# Patient Record
Sex: Female | Born: 1940 | Race: Black or African American | Hispanic: No | Marital: Married | Smoking: Never smoker
Health system: Southern US, Community
[De-identification: ages and names within clinical notes are randomized; demographics above are authoritative.]

## PROBLEM LIST (undated history)

## (undated) DIAGNOSIS — E7800 Pure hypercholesterolemia, unspecified: Secondary | ICD-10-CM

## (undated) DIAGNOSIS — I10 Essential (primary) hypertension: Secondary | ICD-10-CM

## (undated) DIAGNOSIS — J302 Other seasonal allergic rhinitis: Secondary | ICD-10-CM

## (undated) HISTORY — PX: ABDOMINAL HYSTERECTOMY: SHX81

## (undated) HISTORY — PX: COLONOSCOPY: SHX174

---

## 2001-07-25 ENCOUNTER — Other Ambulatory Visit: Admission: RE | Admit: 2001-07-25 | Discharge: 2001-07-25 | Payer: Self-pay | Admitting: Family Medicine

## 2001-12-05 ENCOUNTER — Ambulatory Visit (HOSPITAL_COMMUNITY): Admission: RE | Admit: 2001-12-05 | Discharge: 2001-12-05 | Payer: Self-pay | Admitting: Family Medicine

## 2001-12-05 ENCOUNTER — Encounter: Payer: Self-pay | Admitting: Family Medicine

## 2002-12-06 ENCOUNTER — Encounter: Payer: Self-pay | Admitting: Family Medicine

## 2002-12-06 ENCOUNTER — Ambulatory Visit (HOSPITAL_COMMUNITY): Admission: RE | Admit: 2002-12-06 | Discharge: 2002-12-06 | Payer: Self-pay | Admitting: Family Medicine

## 2003-12-09 ENCOUNTER — Ambulatory Visit (HOSPITAL_COMMUNITY): Admission: RE | Admit: 2003-12-09 | Discharge: 2003-12-09 | Payer: Self-pay | Admitting: Family Medicine

## 2004-12-29 ENCOUNTER — Ambulatory Visit (HOSPITAL_COMMUNITY): Admission: RE | Admit: 2004-12-29 | Discharge: 2004-12-29 | Payer: Self-pay | Admitting: Family Medicine

## 2006-01-04 ENCOUNTER — Ambulatory Visit (HOSPITAL_COMMUNITY): Admission: RE | Admit: 2006-01-04 | Discharge: 2006-01-04 | Payer: Self-pay | Admitting: Family Medicine

## 2007-01-09 ENCOUNTER — Ambulatory Visit (HOSPITAL_COMMUNITY): Admission: RE | Admit: 2007-01-09 | Discharge: 2007-01-09 | Payer: Self-pay | Admitting: Family Medicine

## 2007-02-11 ENCOUNTER — Emergency Department (HOSPITAL_COMMUNITY): Admission: EM | Admit: 2007-02-11 | Discharge: 2007-02-11 | Payer: Self-pay | Admitting: Emergency Medicine

## 2008-07-16 ENCOUNTER — Ambulatory Visit (HOSPITAL_COMMUNITY): Admission: RE | Admit: 2008-07-16 | Discharge: 2008-07-16 | Payer: Self-pay | Admitting: Family Medicine

## 2009-07-21 ENCOUNTER — Ambulatory Visit (HOSPITAL_COMMUNITY): Admission: RE | Admit: 2009-07-21 | Discharge: 2009-07-21 | Payer: Self-pay | Admitting: Family Medicine

## 2010-07-23 ENCOUNTER — Ambulatory Visit (HOSPITAL_COMMUNITY): Admission: RE | Admit: 2010-07-23 | Discharge: 2010-07-23 | Payer: Self-pay | Admitting: Family Medicine

## 2011-06-25 ENCOUNTER — Other Ambulatory Visit (HOSPITAL_COMMUNITY): Payer: Self-pay | Admitting: Family Medicine

## 2011-06-25 DIAGNOSIS — Z139 Encounter for screening, unspecified: Secondary | ICD-10-CM

## 2011-07-26 ENCOUNTER — Ambulatory Visit (HOSPITAL_COMMUNITY)
Admission: RE | Admit: 2011-07-26 | Discharge: 2011-07-26 | Disposition: A | Discharge status: Home / Self Care | Payer: Medicare Other | Source: Ambulatory Visit | Attending: Family Medicine | Admitting: Family Medicine

## 2011-07-26 DIAGNOSIS — Z139 Encounter for screening, unspecified: Secondary | ICD-10-CM

## 2011-07-26 DIAGNOSIS — Z1231 Encounter for screening mammogram for malignant neoplasm of breast: Secondary | ICD-10-CM | POA: Insufficient documentation

## 2012-06-21 ENCOUNTER — Other Ambulatory Visit (HOSPITAL_COMMUNITY): Payer: Self-pay | Admitting: Family Medicine

## 2012-06-21 DIAGNOSIS — Z139 Encounter for screening, unspecified: Secondary | ICD-10-CM

## 2012-07-27 ENCOUNTER — Ambulatory Visit (HOSPITAL_COMMUNITY)
Admission: RE | Admit: 2012-07-27 | Discharge: 2012-07-27 | Disposition: A | Discharge status: Home / Self Care | Payer: Medicare Other | Source: Ambulatory Visit | Attending: Family Medicine | Admitting: Family Medicine

## 2012-07-27 DIAGNOSIS — Z1231 Encounter for screening mammogram for malignant neoplasm of breast: Secondary | ICD-10-CM | POA: Insufficient documentation

## 2012-07-27 DIAGNOSIS — Z139 Encounter for screening, unspecified: Secondary | ICD-10-CM

## 2013-06-19 ENCOUNTER — Other Ambulatory Visit (HOSPITAL_COMMUNITY): Payer: Self-pay | Admitting: Family Medicine

## 2013-06-19 DIAGNOSIS — Z139 Encounter for screening, unspecified: Secondary | ICD-10-CM

## 2013-07-30 ENCOUNTER — Ambulatory Visit (HOSPITAL_COMMUNITY)
Admission: RE | Admit: 2013-07-30 | Discharge: 2013-07-30 | Disposition: A | Discharge status: Home / Self Care | Payer: Medicare Other | Source: Ambulatory Visit | Attending: Family Medicine | Admitting: Family Medicine

## 2013-07-30 DIAGNOSIS — Z139 Encounter for screening, unspecified: Secondary | ICD-10-CM

## 2013-07-30 DIAGNOSIS — Z1231 Encounter for screening mammogram for malignant neoplasm of breast: Secondary | ICD-10-CM | POA: Insufficient documentation

## 2014-06-27 ENCOUNTER — Other Ambulatory Visit (HOSPITAL_COMMUNITY): Payer: Self-pay | Admitting: Family Medicine

## 2014-06-27 DIAGNOSIS — Z1231 Encounter for screening mammogram for malignant neoplasm of breast: Secondary | ICD-10-CM

## 2014-06-27 DIAGNOSIS — Z139 Encounter for screening, unspecified: Secondary | ICD-10-CM

## 2014-07-31 ENCOUNTER — Ambulatory Visit (HOSPITAL_COMMUNITY)
Admission: RE | Admit: 2014-07-31 | Discharge: 2014-07-31 | Disposition: A | Discharge status: Home / Self Care | Payer: Medicare Other | Source: Ambulatory Visit | Attending: Family Medicine | Admitting: Family Medicine

## 2014-07-31 DIAGNOSIS — Z1231 Encounter for screening mammogram for malignant neoplasm of breast: Secondary | ICD-10-CM | POA: Diagnosis not present

## 2014-11-05 ENCOUNTER — Encounter (INDEPENDENT_AMBULATORY_CARE_PROVIDER_SITE_OTHER): Payer: Self-pay | Admitting: *Deleted

## 2014-11-12 ENCOUNTER — Other Ambulatory Visit (INDEPENDENT_AMBULATORY_CARE_PROVIDER_SITE_OTHER): Payer: Self-pay | Admitting: *Deleted

## 2014-11-12 ENCOUNTER — Encounter (INDEPENDENT_AMBULATORY_CARE_PROVIDER_SITE_OTHER): Payer: Self-pay | Admitting: *Deleted

## 2014-11-12 DIAGNOSIS — Z1211 Encounter for screening for malignant neoplasm of colon: Secondary | ICD-10-CM

## 2014-11-25 ENCOUNTER — Telehealth (INDEPENDENT_AMBULATORY_CARE_PROVIDER_SITE_OTHER): Payer: Self-pay | Admitting: *Deleted

## 2014-11-25 DIAGNOSIS — Z1211 Encounter for screening for malignant neoplasm of colon: Secondary | ICD-10-CM

## 2014-11-25 NOTE — Telephone Encounter (Signed)
Patient needs movi prep 

## 2014-11-26 MED ORDER — PEG-KCL-NACL-NASULF-NA ASC-C 100 G PO SOLR: 1.0000 | Freq: Once | ORAL | Status: DC

## 2014-12-13 ENCOUNTER — Telehealth (INDEPENDENT_AMBULATORY_CARE_PROVIDER_SITE_OTHER): Payer: Self-pay | Admitting: *Deleted

## 2014-12-13 NOTE — Telephone Encounter (Signed)
agree

## 2014-12-13 NOTE — Telephone Encounter (Signed)
Referring MD/PCP: tcs   Procedure: screening  Reason/Indication:  Yes, more than 10 years ago  Has patient had this procedure before?  no  If so, when, by whom and where?    Is there a family history of colon cancer?  no  Who?  What age when diagnosed?    Is patient diabetic?   no      Does patient have prosthetic heart valve?  no  Do you have a pacemaker?  no  Has patient ever had endocarditis? no  Has patient had joint replacement within last 12 months?  no  Does patient tend to be constipated or take laxatives? no  Is patient on Coumadin, Plavix and/or Aspirin? no  Medications: hctz 12.5 mg daily, simvastatin 40 mg daily, amlodipine 10 mg daily  Allergies: nkda  Medication Adjustment:   Procedure date & time: 01/09/15 at 830

## 2015-01-09 ENCOUNTER — Ambulatory Visit (HOSPITAL_COMMUNITY)
Admission: RE | Admit: 2015-01-09 | Discharge: 2015-01-09 | Disposition: A | Discharge status: Home Health | Payer: Medicare Other | Source: Ambulatory Visit | Attending: Internal Medicine | Admitting: Internal Medicine

## 2015-01-09 ENCOUNTER — Encounter (HOSPITAL_COMMUNITY)
Admission: RE | Disposition: A | Discharge status: Home Health | Payer: Self-pay | Source: Ambulatory Visit | Attending: Internal Medicine

## 2015-01-09 ENCOUNTER — Encounter (HOSPITAL_COMMUNITY): Payer: Self-pay | Admitting: *Deleted

## 2015-01-09 DIAGNOSIS — E780 Pure hypercholesterolemia: Secondary | ICD-10-CM | POA: Diagnosis not present

## 2015-01-09 DIAGNOSIS — D125 Benign neoplasm of sigmoid colon: Secondary | ICD-10-CM | POA: Diagnosis not present

## 2015-01-09 DIAGNOSIS — K649 Unspecified hemorrhoids: Secondary | ICD-10-CM | POA: Insufficient documentation

## 2015-01-09 DIAGNOSIS — Z1211 Encounter for screening for malignant neoplasm of colon: Secondary | ICD-10-CM | POA: Diagnosis not present

## 2015-01-09 DIAGNOSIS — K648 Other hemorrhoids: Secondary | ICD-10-CM | POA: Diagnosis not present

## 2015-01-09 DIAGNOSIS — J302 Other seasonal allergic rhinitis: Secondary | ICD-10-CM | POA: Insufficient documentation

## 2015-01-09 DIAGNOSIS — K5730 Diverticulosis of large intestine without perforation or abscess without bleeding: Secondary | ICD-10-CM | POA: Diagnosis not present

## 2015-01-09 DIAGNOSIS — I10 Essential (primary) hypertension: Secondary | ICD-10-CM | POA: Diagnosis not present

## 2015-01-09 DIAGNOSIS — Z79899 Other long term (current) drug therapy: Secondary | ICD-10-CM | POA: Insufficient documentation

## 2015-01-09 DIAGNOSIS — D124 Benign neoplasm of descending colon: Secondary | ICD-10-CM | POA: Diagnosis not present

## 2015-01-09 HISTORY — PX: COLONOSCOPY: SHX5424

## 2015-01-09 HISTORY — DX: Essential (primary) hypertension: I10

## 2015-01-09 HISTORY — DX: Other seasonal allergic rhinitis: J30.2

## 2015-01-09 HISTORY — DX: Pure hypercholesterolemia, unspecified: E78.00

## 2015-01-09 SURGERY — COLONOSCOPY
Anesthesia: Moderate Sedation

## 2015-01-09 MED ORDER — MEPERIDINE HCL 50 MG/ML IJ SOLN
INTRAMUSCULAR | Status: DC
Start: 2015-01-09 — End: 2015-01-09
  Filled 2015-01-09: qty 1

## 2015-01-09 MED ORDER — SODIUM CHLORIDE 0.9 % IV SOLN
INTRAVENOUS | Status: DC
  Administered 2015-01-09: 08:00:00 via INTRAVENOUS

## 2015-01-09 MED ORDER — MIDAZOLAM HCL 5 MG/5ML IJ SOLN
INTRAMUSCULAR | Status: DC | PRN
  Administered 2015-01-09 (×3): 2 mg via INTRAVENOUS

## 2015-01-09 MED ORDER — MIDAZOLAM HCL 5 MG/5ML IJ SOLN
INTRAMUSCULAR | Status: AC
  Filled 2015-01-09: qty 10

## 2015-01-09 MED ORDER — STERILE WATER FOR IRRIGATION IR SOLN
Status: DC | PRN
  Administered 2015-01-09: 09:00:00

## 2015-01-09 MED ORDER — MEPERIDINE HCL 50 MG/ML IJ SOLN
INTRAMUSCULAR | Status: DC | PRN
  Administered 2015-01-09 (×2): 25 mg via INTRAVENOUS

## 2015-01-09 NOTE — Discharge Instructions (Signed)
No aspirin or NSAIDs for 1 week. Resume usual medications and high fiber diet. No driving for 24 hours. Physician will call with biopsy results.  Colonoscopy, Care After Refer to this sheet in the next few weeks. These instructions provide you with information on caring for yourself after your procedure. Your health care provider may also give you more specific instructions. Your treatment has been planned according to current medical practices, but problems sometimes occur. Call your health care provider if you have any problems or questions after your procedure. WHAT TO EXPECT AFTER THE PROCEDURE  After your procedure, it is typical to have the following:  A small amount of blood in your stool.  Moderate amounts of gas and mild abdominal cramping or bloating. HOME CARE INSTRUCTIONS  Do not drive, operate machinery, or sign important documents for 24 hours.  You may shower and resume your regular physical activities, but move at a slower pace for the first 24 hours.  Take frequent rest periods for the first 24 hours.  Walk around or put a warm pack on your abdomen to help reduce abdominal cramping and bloating.  Drink enough fluids to keep your urine clear or pale yellow.  You may resume your normal diet as instructed by your health care provider. Avoid heavy or fried foods that are hard to digest.  Avoid drinking alcohol for 24 hours or as instructed by your health care provider.  Only take over-the-counter or prescription medicines as directed by your health care provider.  If a tissue sample (biopsy) was taken during your procedure:  Do not take aspirin or blood thinners for 7 days, or as instructed by your health care provider.  Do not drink alcohol for 7 days, or as instructed by your health care provider.  Eat soft foods for the first 24 hours. SEEK MEDICAL CARE IF: You have persistent spotting of blood in your stool 2-3 days after the procedure. SEEK IMMEDIATE MEDICAL  CARE IF:  You have more than a small spotting of blood in your stool.  You pass large blood clots in your stool.  Your abdomen is swollen (distended).  You have nausea or vomiting.  You have a fever.  You have increasing abdominal pain that is not relieved with medicine. Colon Polyps Polyps are lumps of extra tissue growing inside the body. Polyps can grow in the large intestine (colon). Most colon polyps are noncancerous (benign). However, some colon polyps can become cancerous over time. Polyps that are larger than a pea may be harmful. To be safe, caregivers remove and test all polyps. CAUSES  Polyps form when mutations in the genes cause your cells to grow and divide even though no more tissue is needed. RISK FACTORS There are a number of risk factors that can increase your chances of getting colon polyps. They include:  Being older than 50 years.  Family history of colon polyps or colon cancer.  Long-term colon diseases, such as colitis or Crohn disease.  Being overweight.  Smoking.  Being inactive.  Drinking too much alcohol. SYMPTOMS  Most small polyps do not cause symptoms. If symptoms are present, they may include:  Blood in the stool. The stool may look dark red or black.  Constipation or diarrhea that lasts longer than 1 week. DIAGNOSIS People often do not know they have polyps until their caregiver finds them during a regular checkup. Your caregiver can use 4 tests to check for polyps:  Digital rectal exam. The caregiver wears gloves and feels  inside the rectum. This test would find polyps only in the rectum.  Barium enema. The caregiver puts a liquid called barium into your rectum before taking X-rays of your colon. Barium makes your colon look white. Polyps are dark, so they are easy to see in the X-ray pictures.  Sigmoidoscopy. A thin, flexible tube (sigmoidoscope) is placed into your rectum. The sigmoidoscope has a light and tiny camera in it. The  caregiver uses the sigmoidoscope to look at the last third of your colon.  Colonoscopy. This test is like sigmoidoscopy, but the caregiver looks at the entire colon. This is the most common method for finding and removing polyps. TREATMENT  Any polyps will be removed during a sigmoidoscopy or colonoscopy. The polyps are then tested for cancer. PREVENTION  To help lower your risk of getting more colon polyps:  Eat plenty of fruits and vegetables. Avoid eating fatty foods.  Do not smoke.  Avoid drinking alcohol.  Exercise every day.  Lose weight if recommended by your caregiver.  Eat plenty of calcium and folate. Foods that are rich in calcium include milk, cheese, and broccoli. Foods that are rich in folate include chickpeas, kidney beans, and spinach. HOME CARE INSTRUCTIONS Keep all follow-up appointments as directed by your caregiver. You may need periodic exams to check for polyps. SEEK MEDICAL CARE IF: You notice bleeding during a bowel movement. Diverticulosis Diverticulosis is the condition that develops when small pouches (diverticula) form in the wall of your colon. Your colon, or large intestine, is where water is absorbed and stool is formed. The pouches form when the inside layer of your colon pushes through weak spots in the outer layers of your colon. CAUSES  No one knows exactly what causes diverticulosis. RISK FACTORS  Being older than 56. Your risk for this condition increases with age. Diverticulosis is rare in people younger than 40 years. By age 65, almost everyone has it.  Eating a low-fiber diet.  Being frequently constipated.  Being overweight.  Not getting enough exercise.  Smoking.  Taking over-the-counter pain medicines, like aspirin and ibuprofen. SYMPTOMS  Most people with diverticulosis do not have symptoms. DIAGNOSIS  Because diverticulosis often has no symptoms, health care providers often discover the condition during an exam for other colon  problems. In many cases, a health care provider will diagnose diverticulosis while using a flexible scope to examine the colon (colonoscopy). TREATMENT  If you have never developed an infection related to diverticulosis, you may not need treatment. If you have had an infection before, treatment may include:  Eating more fruits, vegetables, and grains.  Taking a fiber supplement.  Taking a live bacteria supplement (probiotic).  Taking medicine to relax your colon. HOME CARE INSTRUCTIONS   Drink at least 6-8 glasses of water each day to prevent constipation.  Try not to strain when you have a bowel movement.  Keep all follow-up appointments. If you have had an infection before:  Increase the fiber in your diet as directed by your health care provider or dietitian.  Take a dietary fiber supplement if your health care provider approves.  Only take medicines as directed by your health care provider. SEEK MEDICAL CARE IF:   You have abdominal pain.  You have bloating.  You have cramps.  You have not gone to the bathroom in 3 days. SEEK IMMEDIATE MEDICAL CARE IF:   Your pain gets worse.  Yourbloating becomes very bad.  You have a fever or chills, and your symptoms suddenly get  worse.  You begin vomiting.  You have bowel movements that are bloody or black. MAKE SURE YOU:  Understand these instructions.  Will watch your condition.  Will get help right away if you are not doing well or get worse. Hemorrhoids Hemorrhoids are swollen veins around the rectum or anus. There are two types of hemorrhoids:   Internal hemorrhoids. These occur in the veins just inside the rectum. They may poke through to the outside and become irritated and painful.  External hemorrhoids. These occur in the veins outside the anus and can be felt as a painful swelling or hard lump near the anus. CAUSES  Pregnancy.   Obesity.   Constipation or diarrhea.   Straining to have a bowel  movement.   Sitting for long periods on the toilet.  Heavy lifting or other activity that caused you to strain.  Anal intercourse. SYMPTOMS   Pain.   Anal itching or irritation.   Rectal bleeding.   Fecal leakage.   Anal swelling.   One or more lumps around the anus.  DIAGNOSIS  Your caregiver may be able to diagnose hemorrhoids by visual examination. Other examinations or tests that may be performed include:   Examination of the rectal area with a gloved hand (digital rectal exam).   Examination of anal canal using a small tube (scope).   A blood test if you have lost a significant amount of blood.  A test to look inside the colon (sigmoidoscopy or colonoscopy). TREATMENT Most hemorrhoids can be treated at home. However, if symptoms do not seem to be getting better or if you have a lot of rectal bleeding, your caregiver may perform a procedure to help make the hemorrhoids get smaller or remove them completely. Possible treatments include:   Placing a rubber band at the base of the hemorrhoid to cut off the circulation (rubber band ligation).   Injecting a chemical to shrink the hemorrhoid (sclerotherapy).   Using a tool to burn the hemorrhoid (infrared light therapy).   Surgically removing the hemorrhoid (hemorrhoidectomy).   Stapling the hemorrhoid to block blood flow to the tissue (hemorrhoid stapling).  HOME CARE INSTRUCTIONS   Eat foods with fiber, such as whole grains, beans, nuts, fruits, and vegetables. Ask your doctor about taking products with added fiber in them (fibersupplements).  Increase fluid intake. Drink enough water and fluids to keep your urine clear or pale yellow.   Exercise regularly.   Go to the bathroom when you have the urge to have a bowel movement. Do not wait.   Avoid straining to have bowel movements.   Keep the anal area dry and clean. Use wet toilet paper or moist towelettes after a bowel movement.   Medicated  creams and suppositories may be used or applied as directed.   Only take over-the-counter or prescription medicines as directed by your caregiver.   Take warm sitz baths for 15-20 minutes, 3-4 times a day to ease pain and discomfort.   Place ice packs on the hemorrhoids if they are tender and swollen. Using ice packs between sitz baths may be helpful.   Put ice in a plastic bag.   Place a towel between your skin and the bag.   Leave the ice on for 15-20 minutes, 3-4 times a day.   Do not use a donut-shaped pillow or sit on the toilet for long periods. This increases blood pooling and pain.  SEEK MEDICAL CARE IF:  You have increasing pain and swelling that is  not controlled by treatment or medicine.  You have uncontrolled bleeding.  You have difficulty or you are unable to have a bowel movement.  You have pain or inflammation outside the area of the hemorrhoids. MAKE SURE YOU:  Understand these instructions.  Will watch your condition.  Will get help right away if you are not doing well or get worse.

## 2015-01-09 NOTE — H&P (Signed)
Michelle Mcclain is an 74 y.o. female.   Chief Complaint: Patient is here for colonoscopy. HPI: Patient is 74 year old African female who is here for screening colonoscopy. She denies abdominal pain change in bowel habits rectal bleeding. Last colonoscopy was 10 years ago but she does not remember where. Family history is negative for CRC.  Past Medical History  Diagnosis Date  . Hypertension   . Hypercholesteremia   . Seasonal allergies     Past Surgical History  Procedure Laterality Date  . Abdominal hysterectomy    . Colonoscopy      History reviewed. No pertinent family history. Social History:  reports that she has never smoked. She does not have any smokeless tobacco history on file. She reports that she does not drink alcohol or use illicit drugs.  Allergies: No Known Allergies  Medications Prior to Admission  Medication Sig Dispense Refill  . amLODipine (NORVASC) 10 MG tablet Take 10 mg by mouth daily.    . cholecalciferol (VITAMIN D) 1000 UNITS tablet Take 1,000 Units by mouth daily.    . hydrochlorothiazide (MICROZIDE) 12.5 MG capsule Take 12.5 mg by mouth daily.    Marland Kitchen loratadine (CLARITIN) 10 MG tablet Take 10 mg by mouth daily.    . Multiple Vitamins-Minerals (MULTIVITAMINS THER. W/MINERALS) TABS tablet Take 1 tablet by mouth daily.    . peg 3350 powder (MOVIPREP) 100 G SOLR Take 1 kit (200 g total) by mouth once. 1 kit 0  . simvastatin (ZOCOR) 40 MG tablet Take 40 mg by mouth daily.    . vitamin C (ASCORBIC ACID) 500 MG tablet Take 500 mg by mouth daily.      No results found for this or any previous visit (from the past 48 hour(s)). No results found.  ROS  Blood pressure 166/91, pulse 110, temperature 97.8 F (36.6 C), temperature source Oral, resp. rate 22, height 5' 8"  (1.727 m), weight 215 lb (97.523 kg), SpO2 96 %. Physical Exam  Constitutional: She appears well-developed and well-nourished.  HENT:  Mouth/Throat: Oropharynx is clear and moist.   Eyes: Conjunctivae are normal. No scleral icterus.  Neck: No thyromegaly present.  Cardiovascular: Normal rate, regular rhythm and normal heart sounds.   No murmur heard. Respiratory: Effort normal and breath sounds normal.  GI: Soft. She exhibits no distension and no mass. There is no tenderness.  Musculoskeletal: She exhibits no edema.  Lymphadenopathy:    She has no cervical adenopathy.  Neurological: She is alert.  Skin: Skin is warm and dry.     Assessment/Plan Average risk screening colonoscopy.  Rivers Gassmann U 01/09/2015, 8:45 AM

## 2015-01-09 NOTE — Op Note (Signed)
COLONOSCOPY PROCEDURE REPORT  PATIENT:  Michelle Mcclain  MR#:  [id number] Birthdate:  [birth year], 74 y.o., female Endoscopist:  Dr. Rogene Houston, MD Referred By:  Dr. Lanette Hampshire, MD  Procedure Date: 01/09/2015  Procedure:   Colonoscopy  Indications:  Patient is 74 year old African-American female who is here for average risk screening colonoscopy.  Informed Consent:  The procedure and risks were reviewed with the patient and informed consent was obtained.  Medications:  Demerol 50 mg IV Versed 6 mg IV  Description of procedure:  After a digital rectal exam was performed, that colonoscope was advanced from the anus through the rectum and colon to the area of the cecum, ileocecal valve and appendiceal orifice. The cecum was deeply intubated. These structures were well-seen and photographed for the record. From the level of the cecum and ileocecal valve, the scope was slowly and cautiously withdrawn. The mucosal surfaces were carefully surveyed utilizing scope tip to flexion to facilitate fold flattening as needed. The scope was pulled down into the rectum where a thorough exam including retroflexion was performed.  Findings:   Prep excellent. 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 3 mm polyp noted at distal sigmoid colon. It was cold snared. 5 mm polyp with red surface was hot snared also from distal sigmoid colon. Both of these polyps were submitted together. Normal rectal mucosa. Hemorrhoids noted above and below the dentate line.    Therapeutic/Diagnostic Maneuvers Performed:  See above  Complications:  None  Cecal Withdrawal Time:  15  minutes  Impression:  Examination performed to cecum. Moderate number of diverticula at sigmoid colon along with few at hepatic flexure. 3 mm polyp cold snared from distal sigmoid colon. 5 mm polyp hot snare from distal sigmoid colon. Both of these polyps were submitted  together  Recommendations:  Standard instructions given. I will contact patient with biopsy results and further recommendations.  Rykar Lebleu U  01/09/2015 9:32 AM  CC: Dr. Lanette Hampshire, MD & Dr. Rayne Du ref. provider found

## 2015-01-10 ENCOUNTER — Encounter (HOSPITAL_COMMUNITY): Payer: Self-pay | Admitting: Internal Medicine

## 2015-01-21 ENCOUNTER — Encounter (INDEPENDENT_AMBULATORY_CARE_PROVIDER_SITE_OTHER): Payer: Self-pay | Admitting: *Deleted

## 2015-06-27 ENCOUNTER — Other Ambulatory Visit (HOSPITAL_COMMUNITY): Payer: Self-pay | Admitting: Family Medicine

## 2015-06-27 DIAGNOSIS — Z1231 Encounter for screening mammogram for malignant neoplasm of breast: Secondary | ICD-10-CM

## 2015-08-04 ENCOUNTER — Ambulatory Visit (HOSPITAL_COMMUNITY)
Admission: RE | Admit: 2015-08-04 | Discharge: 2015-08-04 | Disposition: A | Discharge status: Home / Self Care | Payer: Medicare Other | Source: Ambulatory Visit | Attending: Family Medicine | Admitting: Family Medicine

## 2015-08-04 DIAGNOSIS — Z1231 Encounter for screening mammogram for malignant neoplasm of breast: Secondary | ICD-10-CM | POA: Diagnosis present

## 2016-07-05 ENCOUNTER — Other Ambulatory Visit (HOSPITAL_COMMUNITY): Payer: Self-pay | Admitting: Internal Medicine

## 2016-07-05 DIAGNOSIS — Z1231 Encounter for screening mammogram for malignant neoplasm of breast: Secondary | ICD-10-CM

## 2016-08-05 ENCOUNTER — Ambulatory Visit (HOSPITAL_COMMUNITY)
Admission: RE | Admit: 2016-08-05 | Discharge: 2016-08-05 | Disposition: A | Discharge status: Home / Self Care | Payer: Medicare Other | Source: Ambulatory Visit | Attending: Internal Medicine | Admitting: Internal Medicine

## 2016-08-05 DIAGNOSIS — Z1231 Encounter for screening mammogram for malignant neoplasm of breast: Secondary | ICD-10-CM | POA: Insufficient documentation

## 2017-06-24 ENCOUNTER — Other Ambulatory Visit (HOSPITAL_COMMUNITY): Payer: Self-pay | Admitting: Internal Medicine

## 2017-06-24 DIAGNOSIS — Z1231 Encounter for screening mammogram for malignant neoplasm of breast: Secondary | ICD-10-CM

## 2017-08-08 ENCOUNTER — Ambulatory Visit (HOSPITAL_COMMUNITY): Payer: Medicare Other

## 2017-08-10 ENCOUNTER — Ambulatory Visit (HOSPITAL_COMMUNITY)
Admission: RE | Admit: 2017-08-10 | Discharge: 2017-08-10 | Disposition: A | Discharge status: Home / Self Care | Payer: Medicare Other | Source: Ambulatory Visit | Attending: Internal Medicine | Admitting: Internal Medicine

## 2017-08-10 DIAGNOSIS — Z1231 Encounter for screening mammogram for malignant neoplasm of breast: Secondary | ICD-10-CM

## 2018-06-30 ENCOUNTER — Other Ambulatory Visit (HOSPITAL_COMMUNITY): Payer: Self-pay | Admitting: Internal Medicine

## 2018-06-30 DIAGNOSIS — Z1231 Encounter for screening mammogram for malignant neoplasm of breast: Secondary | ICD-10-CM

## 2018-08-14 ENCOUNTER — Ambulatory Visit (HOSPITAL_COMMUNITY)
Admission: RE | Admit: 2018-08-14 | Discharge: 2018-08-14 | Disposition: A | Discharge status: Home / Self Care | Payer: Medicare Other | Source: Ambulatory Visit | Attending: Internal Medicine | Admitting: Internal Medicine

## 2018-08-14 ENCOUNTER — Encounter (HOSPITAL_COMMUNITY): Payer: Self-pay

## 2018-08-14 DIAGNOSIS — Z1231 Encounter for screening mammogram for malignant neoplasm of breast: Secondary | ICD-10-CM

## 2019-07-09 ENCOUNTER — Other Ambulatory Visit (HOSPITAL_COMMUNITY): Payer: Self-pay | Admitting: Internal Medicine

## 2019-07-09 DIAGNOSIS — Z1231 Encounter for screening mammogram for malignant neoplasm of breast: Secondary | ICD-10-CM

## 2019-08-22 ENCOUNTER — Other Ambulatory Visit: Payer: Self-pay

## 2019-08-22 ENCOUNTER — Ambulatory Visit (HOSPITAL_COMMUNITY)
Admission: RE | Admit: 2019-08-22 | Discharge: 2019-08-22 | Disposition: A | Discharge status: Home / Self Care | Payer: Medicare Other | Source: Ambulatory Visit | Attending: Internal Medicine | Admitting: Internal Medicine

## 2019-08-22 DIAGNOSIS — Z1231 Encounter for screening mammogram for malignant neoplasm of breast: Secondary | ICD-10-CM | POA: Diagnosis present

## 2019-09-12 ENCOUNTER — Other Ambulatory Visit: Payer: Self-pay

## 2019-09-12 ENCOUNTER — Ambulatory Visit: Payer: Medicare Other | Attending: Internal Medicine

## 2019-09-12 DIAGNOSIS — Z20822 Contact with and (suspected) exposure to covid-19: Secondary | ICD-10-CM

## 2019-09-13 LAB — NOVEL CORONAVIRUS, NAA: SARS-CoV-2, NAA: NOT DETECTED

## 2020-01-25 ENCOUNTER — Other Ambulatory Visit: Payer: Medicare Other

## 2020-07-21 ENCOUNTER — Other Ambulatory Visit (HOSPITAL_COMMUNITY): Payer: Self-pay | Admitting: Adult Health

## 2020-07-21 ENCOUNTER — Other Ambulatory Visit (HOSPITAL_COMMUNITY): Payer: Self-pay | Admitting: General Practice

## 2020-07-21 DIAGNOSIS — Z1231 Encounter for screening mammogram for malignant neoplasm of breast: Secondary | ICD-10-CM

## 2020-08-25 ENCOUNTER — Ambulatory Visit (HOSPITAL_COMMUNITY): Payer: Medicare Other

## 2020-10-22 ENCOUNTER — Other Ambulatory Visit: Payer: Self-pay

## 2020-10-22 ENCOUNTER — Ambulatory Visit (HOSPITAL_COMMUNITY)
Admission: RE | Admit: 2020-10-22 | Discharge: 2020-10-22 | Disposition: A | Discharge status: Home / Self Care | Payer: Medicare Other | Source: Ambulatory Visit | Attending: Adult Health | Admitting: Adult Health

## 2020-10-22 DIAGNOSIS — Z1231 Encounter for screening mammogram for malignant neoplasm of breast: Secondary | ICD-10-CM | POA: Insufficient documentation

## 2021-04-22 ENCOUNTER — Other Ambulatory Visit (HOSPITAL_COMMUNITY): Payer: Self-pay | Admitting: Adult Health

## 2021-04-22 ENCOUNTER — Other Ambulatory Visit: Payer: Self-pay

## 2021-04-22 ENCOUNTER — Ambulatory Visit (HOSPITAL_COMMUNITY)
Admission: RE | Admit: 2021-04-22 | Discharge: 2021-04-22 | Disposition: A | Discharge status: Home / Self Care | Payer: Medicare Other | Source: Ambulatory Visit | Attending: Adult Health | Admitting: Adult Health

## 2021-04-22 DIAGNOSIS — M25561 Pain in right knee: Secondary | ICD-10-CM

## 2022-03-30 IMAGING — DX DG KNEE COMPLETE 4+V*R*
4 series · 4 of 4 positions shown · non-contrast
Comparison: None.

CLINICAL DATA: Right knee pain.

EXAM:
RIGHT KNEE - COMPLETE 4+ VIEW

[knee ap]
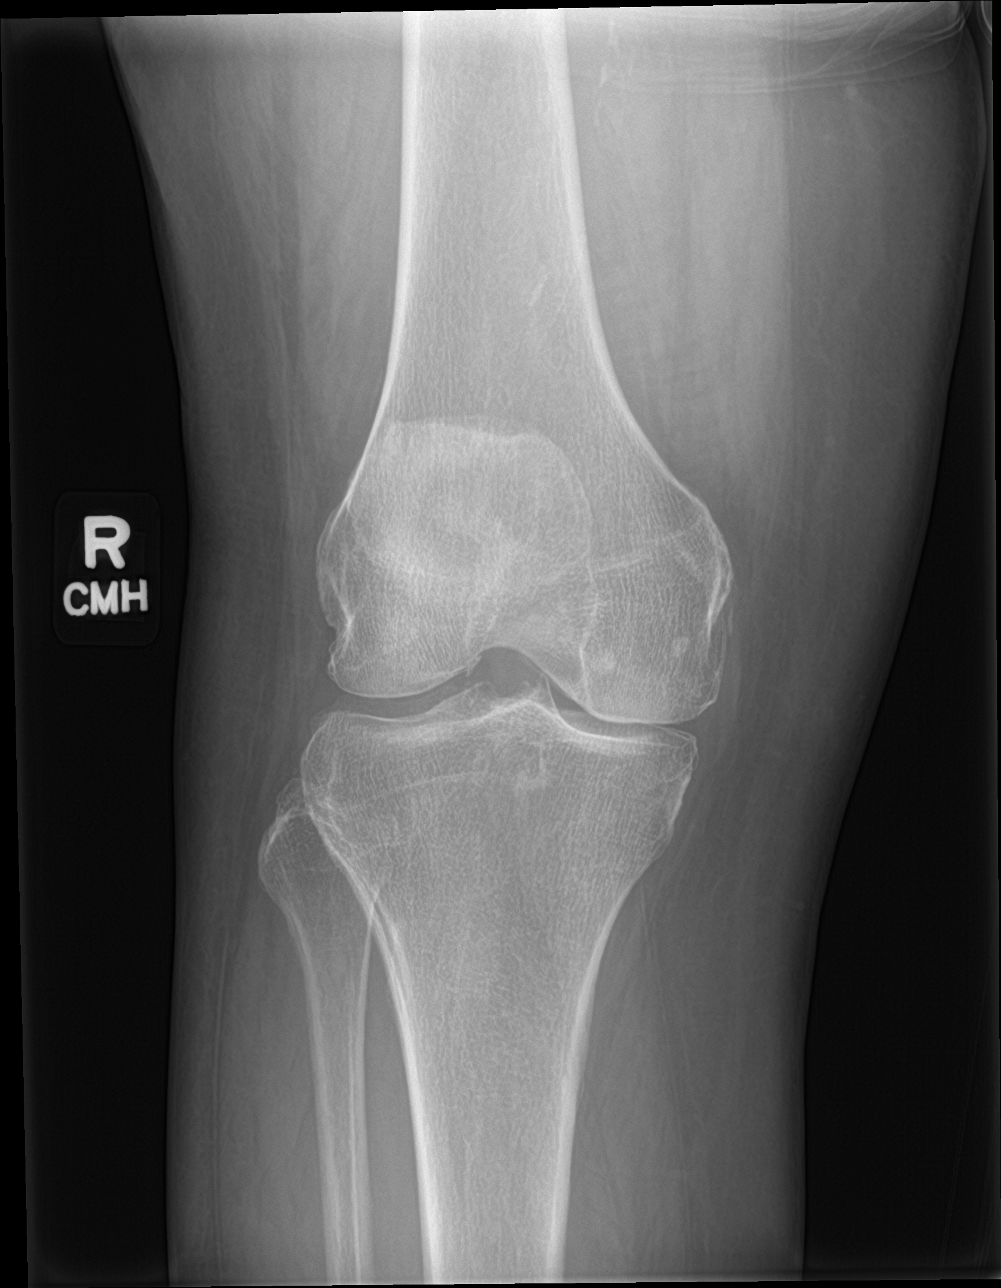

[knee obl (1 of 2)]
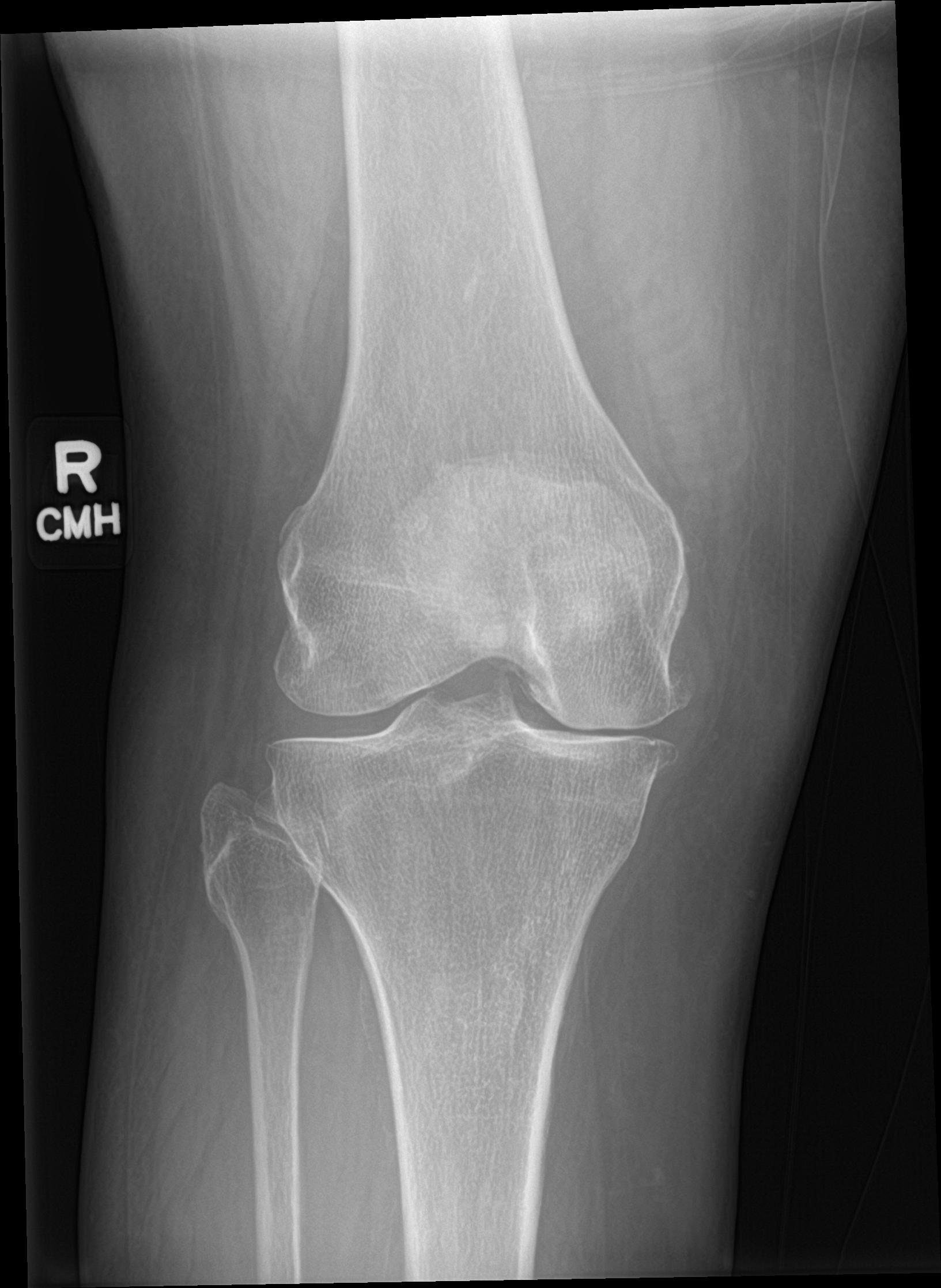

[knee obl (2 of 2)]
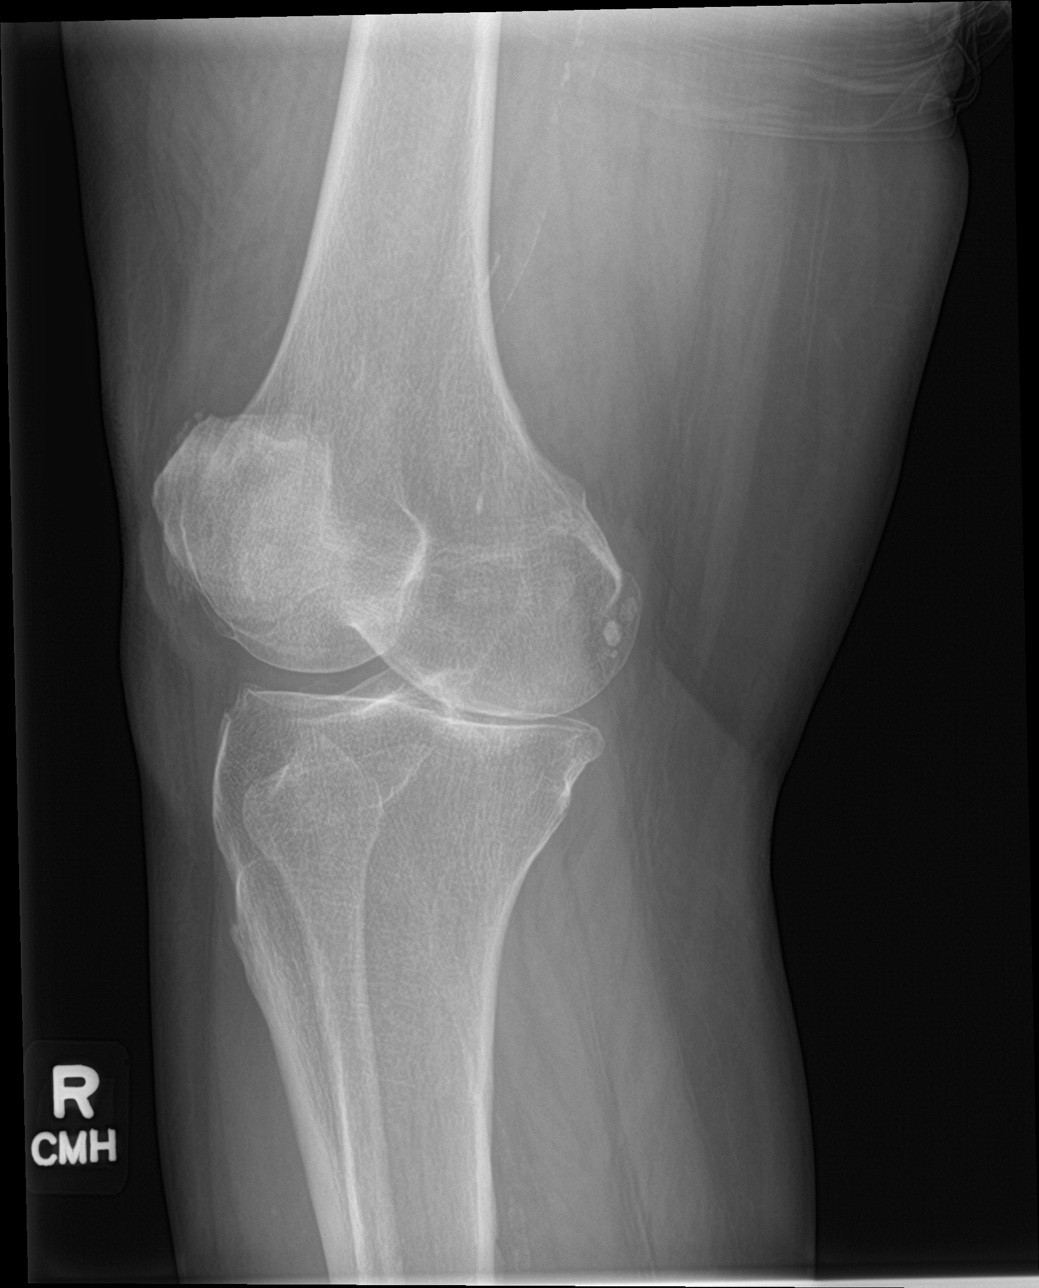

[knee lat]
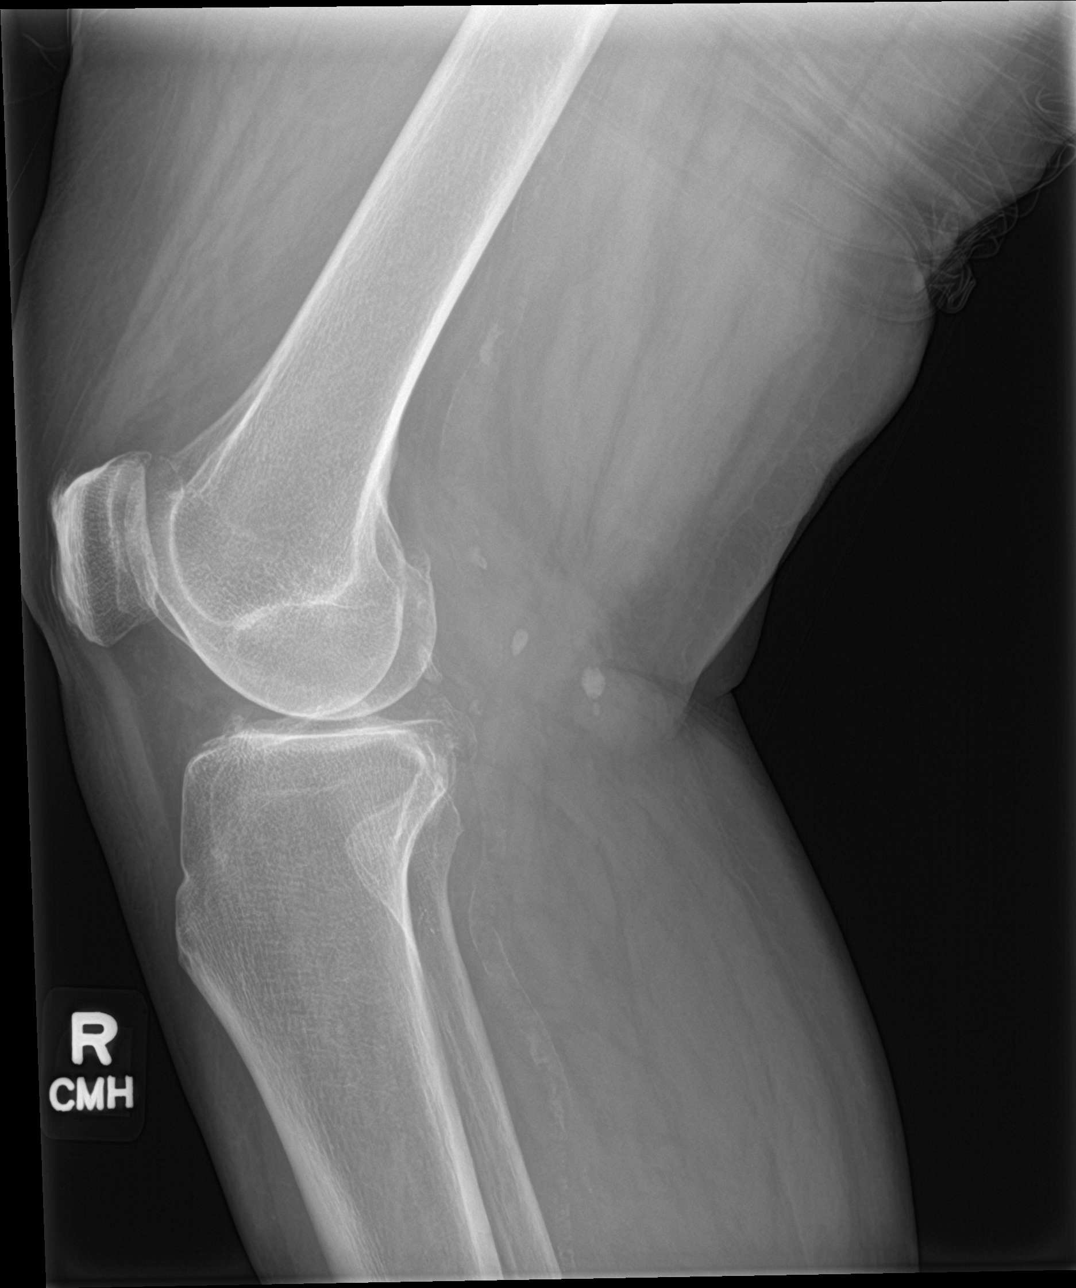

[4 of 4 positions shown; findings below may reference images not displayed]

FINDINGS: No evidence of fracture, dislocation, or joint effusion. Moderate
osteoarthritis with moderate right joint space narrowing. Mild
patellofemoral disease. No bony lesions identified. Soft tissues are
unremarkable.
IMPRESSION: Moderate osteoarthritis with moderate medial joint space narrowing
present. Mild patellofemoral disease.

## 2023-09-05 ENCOUNTER — Encounter (HOSPITAL_COMMUNITY): Payer: Self-pay | Admitting: Emergency Medicine

## 2023-09-05 ENCOUNTER — Emergency Department (HOSPITAL_COMMUNITY): Payer: Medicare Other

## 2023-09-05 ENCOUNTER — Inpatient Hospital Stay (HOSPITAL_COMMUNITY)
Admission: EM | Admit: 2023-09-05 | Discharge: 2023-09-10 | DRG: 871 | Disposition: A | Discharge status: Skilled Nursing Facility | Payer: Medicare Other | Attending: Internal Medicine | Admitting: Internal Medicine

## 2023-09-05 ENCOUNTER — Other Ambulatory Visit: Payer: Self-pay

## 2023-09-05 DIAGNOSIS — A419 Sepsis, unspecified organism: Principal | ICD-10-CM | POA: Diagnosis present

## 2023-09-05 DIAGNOSIS — G9341 Metabolic encephalopathy: Secondary | ICD-10-CM | POA: Insufficient documentation

## 2023-09-05 DIAGNOSIS — N12 Tubulo-interstitial nephritis, not specified as acute or chronic: Secondary | ICD-10-CM | POA: Diagnosis present

## 2023-09-05 DIAGNOSIS — N390 Urinary tract infection, site not specified: Secondary | ICD-10-CM | POA: Diagnosis present

## 2023-09-05 DIAGNOSIS — E119 Type 2 diabetes mellitus without complications: Secondary | ICD-10-CM

## 2023-09-05 DIAGNOSIS — E860 Dehydration: Secondary | ICD-10-CM | POA: Diagnosis not present

## 2023-09-05 DIAGNOSIS — R5381 Other malaise: Secondary | ICD-10-CM | POA: Diagnosis present

## 2023-09-05 DIAGNOSIS — Z1152 Encounter for screening for COVID-19: Secondary | ICD-10-CM

## 2023-09-05 DIAGNOSIS — E669 Obesity, unspecified: Secondary | ICD-10-CM | POA: Diagnosis present

## 2023-09-05 DIAGNOSIS — L89302 Pressure ulcer of unspecified buttock, stage 2: Secondary | ICD-10-CM | POA: Diagnosis present

## 2023-09-05 DIAGNOSIS — N3090 Cystitis, unspecified without hematuria: Principal | ICD-10-CM | POA: Diagnosis present

## 2023-09-05 DIAGNOSIS — E782 Mixed hyperlipidemia: Secondary | ICD-10-CM | POA: Insufficient documentation

## 2023-09-05 DIAGNOSIS — N179 Acute kidney failure, unspecified: Secondary | ICD-10-CM | POA: Insufficient documentation

## 2023-09-05 DIAGNOSIS — F0390 Unspecified dementia without behavioral disturbance: Secondary | ICD-10-CM | POA: Insufficient documentation

## 2023-09-05 DIAGNOSIS — R948 Abnormal results of function studies of other organs and systems: Secondary | ICD-10-CM | POA: Diagnosis present

## 2023-09-05 DIAGNOSIS — E870 Hyperosmolality and hypernatremia: Secondary | ICD-10-CM | POA: Insufficient documentation

## 2023-09-05 DIAGNOSIS — Z7901 Long term (current) use of anticoagulants: Secondary | ICD-10-CM

## 2023-09-05 DIAGNOSIS — Z79899 Other long term (current) drug therapy: Secondary | ICD-10-CM

## 2023-09-05 DIAGNOSIS — E876 Hypokalemia: Secondary | ICD-10-CM | POA: Insufficient documentation

## 2023-09-05 DIAGNOSIS — R935 Abnormal findings on diagnostic imaging of other abdominal regions, including retroperitoneum: Secondary | ICD-10-CM | POA: Insufficient documentation

## 2023-09-05 DIAGNOSIS — N3000 Acute cystitis without hematuria: Secondary | ICD-10-CM

## 2023-09-05 DIAGNOSIS — I2699 Other pulmonary embolism without acute cor pulmonale: Secondary | ICD-10-CM | POA: Insufficient documentation

## 2023-09-05 DIAGNOSIS — I2489 Other forms of acute ischemic heart disease: Secondary | ICD-10-CM | POA: Diagnosis present

## 2023-09-05 DIAGNOSIS — E1142 Type 2 diabetes mellitus with diabetic polyneuropathy: Secondary | ICD-10-CM | POA: Diagnosis present

## 2023-09-05 DIAGNOSIS — I5A Non-ischemic myocardial injury (non-traumatic): Secondary | ICD-10-CM | POA: Diagnosis not present

## 2023-09-05 DIAGNOSIS — I429 Cardiomyopathy, unspecified: Secondary | ICD-10-CM

## 2023-09-05 DIAGNOSIS — Z6832 Body mass index (BMI) 32.0-32.9, adult: Secondary | ICD-10-CM

## 2023-09-05 DIAGNOSIS — T502X5A Adverse effect of carbonic-anhydrase inhibitors, benzothiadiazides and other diuretics, initial encounter: Secondary | ICD-10-CM | POA: Diagnosis present

## 2023-09-05 DIAGNOSIS — I214 Non-ST elevation (NSTEMI) myocardial infarction: Secondary | ICD-10-CM | POA: Insufficient documentation

## 2023-09-05 DIAGNOSIS — L8990 Pressure ulcer of unspecified site, unspecified stage: Secondary | ICD-10-CM | POA: Insufficient documentation

## 2023-09-05 DIAGNOSIS — Z7982 Long term (current) use of aspirin: Secondary | ICD-10-CM

## 2023-09-05 DIAGNOSIS — R9389 Abnormal findings on diagnostic imaging of other specified body structures: Secondary | ICD-10-CM | POA: Insufficient documentation

## 2023-09-05 DIAGNOSIS — I10 Essential (primary) hypertension: Secondary | ICD-10-CM | POA: Insufficient documentation

## 2023-09-05 DIAGNOSIS — Z7984 Long term (current) use of oral hypoglycemic drugs: Secondary | ICD-10-CM

## 2023-09-05 LAB — SALICYLATE LEVEL: Salicylate Lvl: <7 mg/dL — ABNORMAL LOW (ref 7.0–30.0)

## 2023-09-05 LAB — RESP PANEL BY RT-PCR (RSV, FLU A&B, COVID)  RVPGX2
Influenza A by PCR: NEGATIVE
Influenza B by PCR: NEGATIVE
Resp Syncytial Virus by PCR: NEGATIVE
SARS Coronavirus 2 by RT PCR: NEGATIVE

## 2023-09-05 LAB — TROPONIN I (HIGH SENSITIVITY)
Troponin I (High Sensitivity): 4 ng/L (ref <18)
Troponin I (High Sensitivity): 8 ng/L (ref <18)

## 2023-09-05 LAB — COMPREHENSIVE METABOLIC PANEL
ALT: 24 U/L (ref 0–44)
AST: 31 U/L (ref 15–41)
Albumin: 4.1 g/dL (ref 3.5–5.0)
Alkaline Phosphatase: 64 U/L (ref 38–126)
Anion gap: 25 — ABNORMAL HIGH (ref 5–15)
BUN: 48 mg/dL — ABNORMAL HIGH (ref 8–23)
CO2: 20 mmol/L — ABNORMAL LOW (ref 22–32)
Calcium: 10.7 mg/dL — ABNORMAL HIGH (ref 8.9–10.3)
Chloride: 101 mmol/L (ref 98–111)
Creatinine, Ser: 1.36 mg/dL — ABNORMAL HIGH (ref 0.44–1.00)
GFR, Estimated: 39 mL/min — ABNORMAL LOW (ref >60)
Glucose, Bld: 145 mg/dL — ABNORMAL HIGH (ref 70–99)
Potassium: 4.5 mmol/L (ref 3.5–5.1)
Sodium: 146 mmol/L — ABNORMAL HIGH (ref 135–145)
Total Bilirubin: 1.2 mg/dL — ABNORMAL HIGH (ref <1.2)
Total Protein: 9.3 g/dL — ABNORMAL HIGH (ref 6.5–8.1)

## 2023-09-05 LAB — CBC WITH DIFFERENTIAL/PLATELET
Abs Immature Granulocytes: 0.05 10*3/uL (ref 0.00–0.07)
Basophils Absolute: 0 10*3/uL (ref 0.0–0.1)
Basophils Relative: 0 %
Eosinophils Absolute: 0.1 10*3/uL (ref 0.0–0.5)
Eosinophils Relative: 1 %
HCT: 48.2 % — ABNORMAL HIGH (ref 36.0–46.0)
Hemoglobin: 14.1 g/dL (ref 12.0–15.0)
Immature Granulocytes: 0 %
Lymphocytes Relative: 20 %
Lymphs Abs: 2.7 10*3/uL (ref 0.7–4.0)
MCH: 24.4 pg — ABNORMAL LOW (ref 26.0–34.0)
MCHC: 29.3 g/dL — ABNORMAL LOW (ref 30.0–36.0)
MCV: 83.4 fL (ref 80.0–100.0)
Monocytes Absolute: 1.2 10*3/uL — ABNORMAL HIGH (ref 0.1–1.0)
Monocytes Relative: 9 %
Neutro Abs: 9.1 10*3/uL — ABNORMAL HIGH (ref 1.7–7.7)
Neutrophils Relative %: 70 %
Platelets: 419 10*3/uL — ABNORMAL HIGH (ref 150–400)
RBC: 5.78 MIL/uL — ABNORMAL HIGH (ref 3.87–5.11)
RDW: 14.2 % (ref 11.5–15.5)
WBC: 13.2 10*3/uL — ABNORMAL HIGH (ref 4.0–10.5)
nRBC: 0 % (ref 0.0–0.2)

## 2023-09-05 LAB — URINALYSIS, W/ REFLEX TO CULTURE (INFECTION SUSPECTED)
Bilirubin Urine: NEGATIVE
Glucose, UA: NEGATIVE mg/dL
Ketones, ur: 20 mg/dL — AB
Nitrite: NEGATIVE
Protein, ur: 30 mg/dL — AB
Specific Gravity, Urine: 1.021 (ref 1.005–1.030)
pH: 5 (ref 5.0–8.0)

## 2023-09-05 LAB — MAGNESIUM: Magnesium: 2.2 mg/dL (ref 1.7–2.4)

## 2023-09-05 LAB — LACTIC ACID, PLASMA
Lactic Acid, Venous: 3.6 mmol/L (ref 0.5–1.9)
Lactic Acid, Venous: 2.9 mmol/L (ref 0.5–1.9)

## 2023-09-05 LAB — BETA-HYDROXYBUTYRIC ACID: Beta-Hydroxybutyric Acid: 3.75 mmol/L — ABNORMAL HIGH (ref 0.05–0.27)

## 2023-09-05 LAB — ACETAMINOPHEN LEVEL: Acetaminophen (Tylenol), Serum: <10 ug/mL — ABNORMAL LOW (ref 10–30)

## 2023-09-05 MED ORDER — LACTATED RINGERS IV BOLUS (SEPSIS)
1000.0000 mL | Freq: Once | INTRAVENOUS | Status: AC
  Administered 2023-09-05: 1000 mL via INTRAVENOUS

## 2023-09-05 MED ORDER — LACTATED RINGERS IV BOLUS: 1000.0000 mL | Freq: Once | INTRAVENOUS | Status: DC

## 2023-09-05 MED ORDER — SODIUM CHLORIDE 0.9 % IV BOLUS
1000.0000 mL | Freq: Once | INTRAVENOUS | Status: AC
  Administered 2023-09-05: 1000 mL via INTRAVENOUS

## 2023-09-05 MED ORDER — SODIUM CHLORIDE 0.9 % IV SOLN
1.0000 g | Freq: Once | INTRAVENOUS | Status: AC
  Administered 2023-09-05: 1 g via INTRAVENOUS
  Filled 2023-09-05: qty 10

## 2023-09-05 NOTE — ED Notes (Signed)
Nurse is attempting to get an US guided IV

## 2023-09-05 NOTE — ED Triage Notes (Signed)
Pt from home and has been laying in the bed since Friday, soaked in urine. Family member takes care of pt but pt would not allow caregiver to change her d/t pain in legs.  Pt has dementia.

## 2023-09-05 NOTE — ED Notes (Signed)
EDP will attempt ultrasound IV

## 2023-09-05 NOTE — ED Provider Notes (Signed)
[facility]MENT AT [facility] Provider Note   CSN: [id number] Arrival date & time: 09/05/23  1522     History  No chief complaint on file.   Michelle Mcclain is a 82 y.o. female.  HPI Patient presents for generalized weakness and altered mental status.  Medical history includes HLD, HTN.  EMS reports that she has dementia at baseline.  She arrives from home.  Family reports that she has had increased confusion over the past several days.  She has refused to take her home medications.  She has refused to let them change or clean her.  She has essentially been sitting in her own urine for the past 3 days.  EMS noted tachycardia and tachypnea prior to arrival.  Patient, herself, denies any current complaints at rest.  EMS noted that she endorsed diffuse pain when being moved.    Home Medications Prior to Admission medications   Medication Sig Start Date End Date Taking? Authorizing Provider  atorvastatin (LIPITOR) 80 MG tablet Take 80 mg by mouth daily. 09/04/23  Yes [provider]  hydrochlorothiazide (HYDRODIURIL) 50 MG tablet Take 50 mg by mouth daily. 08/04/23  Yes [provider]  losartan (COZAAR) 100 MG tablet Take 100 mg by mouth daily. 09/04/23  Yes [provider]  memantine (NAMENDA) 10 MG tablet Take 10 mg by mouth 2 (two) times daily. 07/01/23  Yes [provider]  metFORMIN (GLUCOPHAGE) 1000 MG tablet Take 1,000 mg by mouth 2 (two) times daily. 08/04/23  Yes [provider]  metoprolol succinate (TOPROL-XL) 25 MG 24 hr tablet Take 25 mg by mouth daily. 06/30/23  Yes [provider]  Multiple Vitamins-Minerals (MULTIVITAMINS THER. W/MINERALS) TABS tablet Take 1 tablet by mouth daily.   Yes [provider]  pregabalin (LYRICA) 75 MG capsule Take 75 mg by mouth 2 (two) times daily. 07/04/23  Yes [provider]      Allergies    Patient has no known allergies.    Review of  Systems   Review of Systems  Unable to perform ROS: Mental status change    Physical Exam Updated Vital Signs BP 116/84   Pulse (!) 115   Temp 98.1 F (36.7 C) (Rectal)   Resp 18   Ht 5\' 8"  (1.727 m)   Wt 97 kg   SpO2 92%   BMI 32.52 kg/m  Physical Exam Vitals and nursing note reviewed.  Constitutional:      General: She is not in acute distress.    Appearance: She is well-developed. She is not ill-appearing, toxic-appearing or diaphoretic.  HENT:     Head: Normocephalic and atraumatic.     Right Ear: External ear normal.     Left Ear: External ear normal.     Nose: Nose normal.     Mouth/Throat:     Mouth: Mucous membranes are moist.  Eyes:     Extraocular Movements: Extraocular movements intact.     Conjunctiva/sclera: Conjunctivae normal.  Cardiovascular:     Rate and Rhythm: Regular rhythm. Tachycardia present.  Pulmonary:     Effort: Pulmonary effort is normal. No respiratory distress.     Breath sounds: No wheezing or rales.  Abdominal:     General: There is no distension.     Palpations: Abdomen is soft.     Tenderness: There is no abdominal tenderness. There is no right CVA tenderness or left CVA tenderness.  Musculoskeletal:        General:  No swelling. Normal range of motion.     Cervical back: Normal range of motion and neck supple.     Right lower leg: No edema.     Left lower leg: No edema.  Skin:    General: Skin is warm and dry.     Coloration: Skin is not jaundiced or pale.  Neurological:     General: No focal deficit present.     Mental Status: She is alert. She is disoriented.  Psychiatric:        Mood and Affect: Mood normal.        Behavior: Behavior normal.     ED Results / Procedures / Treatments   Labs (all labs ordered are listed, but only abnormal results are displayed) Labs Reviewed  LACTIC ACID, PLASMA - Abnormal; Notable for the following components:      Result Value   Lactic Acid, Venous 3.6 (*)    All other components  within normal limits  LACTIC ACID, PLASMA - Abnormal; Notable for the following components:   Lactic Acid, Venous 2.9 (*)    All other components within normal limits  COMPREHENSIVE METABOLIC PANEL - Abnormal; Notable for the following components:   Sodium 146 (*)    CO2 20 (*)    Glucose, Bld 145 (*)    BUN 48 (*)    Creatinine, Ser 1.36 (*)    Calcium 10.7 (*)    Total Protein 9.3 (*)    Total Bilirubin 1.2 (*)    GFR, Estimated 39 (*)    Anion gap 25 (*)    All other components within normal limits  URINALYSIS, W/ REFLEX TO CULTURE (INFECTION SUSPECTED) - Abnormal; Notable for the following components:   APPearance CLOUDY (*)    Hgb urine dipstick LARGE (*)    Ketones, ur 20 (*)    Protein, ur 30 (*)    Leukocytes,Ua MODERATE (*)    Bacteria, UA FEW (*)    All other components within normal limits  CBC WITH DIFFERENTIAL/PLATELET - Abnormal; Notable for the following components:   WBC 13.2 (*)    RBC 5.78 (*)    HCT 48.2 (*)    MCH 24.4 (*)    MCHC 29.3 (*)    Platelets 419 (*)    Neutro Abs 9.1 (*)    Monocytes Absolute 1.2 (*)    All other components within normal limits  BETA-HYDROXYBUTYRIC ACID - Abnormal; Notable for the following components:   Beta-Hydroxybutyric Acid 3.75 (*)    All other components within normal limits  SALICYLATE LEVEL - Abnormal; Notable for the following components:   Salicylate Lvl <7.0 (*)    All other components within normal limits  ACETAMINOPHEN LEVEL - Abnormal; Notable for the following components:   Acetaminophen (Tylenol), Serum <10 (*)    All other components within normal limits  RESP PANEL BY RT-PCR (RSV, FLU A&B, COVID)  RVPGX2  CULTURE, BLOOD (ROUTINE X 2)  CULTURE, BLOOD (ROUTINE X 2)  MAGNESIUM  CBC WITH DIFFERENTIAL/PLATELET  TROPONIN I (HIGH SENSITIVITY)  TROPONIN I (HIGH SENSITIVITY)    EKG EKG Interpretation Date/Time:  Monday September 05 2023 17:39:46 EST Ventricular Rate:  95 PR Interval:    QRS  Duration:  88 QT Interval:  388 QTC Calculation: 496 R Axis:   10  Text Interpretation: Sinus rhythm Confirmed by Gloris Manchester ([id number]) on 09/05/2023 7:40:17 PM  Radiology CT CHEST ABDOMEN PELVIS WO CONTRAST  Result Date: 09/05/2023 CLINICAL DATA:  Sepsis, dementia EXAM: CT  CHEST, ABDOMEN AND PELVIS WITHOUT CONTRAST TECHNIQUE: Multidetector CT imaging of the chest, abdomen and pelvis was performed following the standard protocol without IV contrast. RADIATION DOSE REDUCTION: This exam was performed according to the departmental dose-optimization program which includes automated exposure control, adjustment of the mA and/or kV according to patient size and/or use of iterative reconstruction technique. COMPARISON:  None Available. FINDINGS: CT CHEST FINDINGS Cardiovascular: The heart is normal in size. No pericardial effusion. No evidence of thoracic aortic aneurysm. Atherosclerotic calcifications of the aortic arch. Severe three-coronary atherosclerosis. Mediastinum/Nodes: No suspicious mediastinal lymphadenopathy. Subcentimeter calcified left thyroid nodules, benign. No follow-up is recommended. Lungs/Pleura: Mild subpleural ground-glass opacity with central low-density at the lateral right lung base (series 4/image 108). This appearance is nonspecific but at least raises the possibility of an early pulmonary infarct. Mild bibasilar atelectasis/scarring. No suspicious pulmonary nodules. No pleural effusion or pneumothorax. Musculoskeletal: Degenerative changes of the mid thoracic spine. CT ABDOMEN PELVIS FINDINGS Hepatobiliary: Unenhanced liver is unremarkable. Gallbladder is unremarkable. No intrahepatic or extrahepatic duct dilatation. Pancreas: Within normal limits. Spleen: Within normal limits but Adrenals/Urinary Tract: Adrenal glands are within normal limits. Left kidney is within normal limits. Right renal cysts measuring up to 2.0 cm (series 3/image 69), benign (Bosniak I). No follow-up is recommended.  No renal, ureteral, or bladder calculi.  No hydronephrosis. Bladder is within normal limits. Stomach/Bowel: Stomach is notable for a tiny hiatal hernia. Soft tissue prominence at the gastric cardia (series 3/image 51), poorly evaluated on unenhanced CT, but at least raising the possibility of a mass. No evidence of bowel obstruction. Appendix is not discretely visualized. Extensive sigmoid diverticulosis, without evidence of diverticulitis. Vascular/Lymphatic: No evidence of abdominal aortic aneurysm. Atherosclerotic calcifications of the abdominal aorta and branch vessels. No suspicious abdominopelvic lymphadenopathy. Reproductive: Status post hysterectomy. No adnexal masses. Other: No abdominopelvic ascites. Musculoskeletal: Mild degenerative changes at L5-S1. IMPRESSION: Focal ground-glass opacity at the right lung base, nonspecific. However, early pulmonary infarct could have this appearance. Consider PE protocol CT for further evaluation, as clinically warranted. Soft tissue prominence at the gastric cardia, poorly evaluated on unenhanced CT, but at least raising the possibility of a mass. Consider CT abdomen with contrast for further evaluation. Extensive sigmoid diverticulosis, without evidence of diverticulitis. Aortic Atherosclerosis (ICD10-I70.0). Electronically Signed   By: Charline Bills M.D.   On: 09/05/2023 20:56   CT Head Wo Contrast  Result Date: 09/05/2023 CLINICAL DATA:  Altered mental status. EXAM: CT HEAD WITHOUT CONTRAST TECHNIQUE: Contiguous axial images were obtained from the base of the skull through the vertex without intravenous contrast. RADIATION DOSE REDUCTION: This exam was performed according to the departmental dose-optimization program which includes automated exposure control, adjustment of the mA and/or kV according to patient size and/or use of iterative reconstruction technique. COMPARISON:  None Available. FINDINGS: Brain: Mild age-related atrophy and chronic  microvascular ischemic changes. There is no acute intracranial hemorrhage. No mass effect or midline shift. No extra-axial fluid collection. Vascular: No hyperdense vessel or unexpected calcification. Skull: Normal. Negative for fracture or focal lesion. Sinuses/Orbits: No acute finding. Other: None IMPRESSION: 1. No acute intracranial pathology. 2. Mild age-related atrophy and chronic microvascular ischemic changes. Electronically Signed   By: Elgie Collard M.D.   On: 09/05/2023 20:45   DG Chest Port 1 View  Result Date: 09/05/2023 CLINICAL DATA:  Questionable sepsis - evaluate for abnormality EXAM: PORTABLE CHEST 1 VIEW COMPARISON:  None Available. FINDINGS: Normal heart size. Normal mediastinal contour. No pneumothorax. No pleural effusion. Lungs appear clear, with  no acute consolidative airspace disease and no pulmonary edema. IMPRESSION: No active disease. Electronically Signed   By: Delbert Phenix M.D.   On: 09/05/2023 17:44    Procedures Procedures    Medications Ordered in ED Medications  sodium chloride 0.9 % bolus 1,000 mL (has no administration in time range)  lactated ringers bolus 1,000 mL (0 mLs Intravenous Stopped 09/05/23 1805)  cefTRIAXone (ROCEPHIN) 1 g in sodium chloride 0.9 % 100 mL IVPB (0 g Intravenous Stopped 09/05/23 1837)    ED Course/ Medical Decision Making/ A&P                                 Medical Decision Making Amount and/or Complexity of Data Reviewed Labs: ordered. Radiology: ordered. ECG/medicine tests: ordered.   This patient presents to the ED for concern of generalized weakness and altered mental status, this involves an extensive number of treatment options, and is a complaint that carries with it a high risk of complications and morbidity.  The differential diagnosis includes infection, sepsis, deconditioning, polypharmacy, medication withdrawal, occult injury, dehydration, metabolic derangements   Co morbidities that complicate the patient  evaluation  HLD, HTN, dementia   Additional history obtained:  Additional history obtained from EMS External records from outside source obtained and reviewed including EMR   Lab Tests:  I Ordered, and personally interpreted labs.  The pertinent results include: Elevated lactic acid, azotemia, and beta-hydroxybutyrate consistent with dehydration.  There is a mild leukocytosis.  Creatinine is baseline.  Urinalysis shows evidence of UTI.   Imaging Studies ordered:  I ordered imaging studies including chest x-ray, CT of head, chest, abdomen, pelvis I independently visualized and interpreted imaging which showed focal groundglass opacity at right lung base, concerning for possible pulmonary infarct.  There is a soft tissue prominence at gastric cardia, raising concern for mass.  No other acute findings. I agree with the radiologist interpretation   Cardiac Monitoring: / EKG:  The patient was maintained on a cardiac monitor.  I personally viewed and interpreted the cardiac monitored which showed an underlying rhythm of: Sinus rhythm  Problem List / ED Course / Critical interventions / Medication management  Patient presenting for generalized weakness and acute on chronic confusion.  EMS noted tachypnea and tachycardia prior to arrival.  This is present on her arrival in the ED.  Her history is limited by baseline dementia as well as what is reportedly acute confusion.  She does not have any focal neurologic deficits on exam.  She denies any current complaints.  When being moved, she does seem to have pain in bilateral legs.  There is no appreciable swelling or deformities.  There are no appreciable skin findings.  Patient was cleansed of her soiled closed.  Urine was obtained by In-N-Out catheter.  Workup was initiated.  Patient's lab work shows azotemia, lactic acidosis, and ketosis concerning for starvation and dehydration.  Patient's daughter states that this is suspected given that  patient has not ate or drink for the past 3 days.  Currently, creatinine does appear to be baseline.  Patient did seem improved to her daughter after IV fluids.  Additional liter was ordered.  Urinalysis does show evidence of UTI.  Ceftriaxone was ordered.  Patient went CT imaging of chest, abdomen, pelvis, which did show some concern of focal ground opacity in lung concerning for possible infarct.  There was also concern of gastric cardia mass.  Enhance studies  would be beneficial.  Given low GFR and dehydration, this would be more appropriate after patient is fluid resuscitated.  For now, patient to be admitted for further management. I ordered medication including IV fluids for hydration; ceftriaxone for UTI Reevaluation of the patient after these medicines showed that the patient improved I have reviewed the patients home medicines and have made adjustments as needed   Social Determinants of Health:  Lives at home with family         Final Clinical Impression(s) / ED Diagnoses Final diagnoses:  Cystitis  Dehydration    Rx / DC Orders ED Discharge Orders     None         Gloris Manchester, MD 09/05/23 2131

## 2023-09-05 NOTE — ED Notes (Signed)
Attempting to get IV access. Pt is a difficult stick.

## 2023-09-06 ENCOUNTER — Inpatient Hospital Stay (HOSPITAL_COMMUNITY): Payer: Medicare Other

## 2023-09-06 DIAGNOSIS — N179 Acute kidney failure, unspecified: Secondary | ICD-10-CM | POA: Diagnosis present

## 2023-09-06 DIAGNOSIS — N390 Urinary tract infection, site not specified: Secondary | ICD-10-CM | POA: Diagnosis not present

## 2023-09-06 DIAGNOSIS — Z7984 Long term (current) use of oral hypoglycemic drugs: Secondary | ICD-10-CM | POA: Diagnosis not present

## 2023-09-06 DIAGNOSIS — E782 Mixed hyperlipidemia: Secondary | ICD-10-CM | POA: Diagnosis present

## 2023-09-06 DIAGNOSIS — I429 Cardiomyopathy, unspecified: Secondary | ICD-10-CM | POA: Diagnosis not present

## 2023-09-06 DIAGNOSIS — A419 Sepsis, unspecified organism: Secondary | ICD-10-CM

## 2023-09-06 DIAGNOSIS — R935 Abnormal findings on diagnostic imaging of other abdominal regions, including retroperitoneum: Secondary | ICD-10-CM | POA: Diagnosis not present

## 2023-09-06 DIAGNOSIS — G9341 Metabolic encephalopathy: Secondary | ICD-10-CM | POA: Diagnosis present

## 2023-09-06 DIAGNOSIS — Z6832 Body mass index (BMI) 32.0-32.9, adult: Secondary | ICD-10-CM | POA: Diagnosis not present

## 2023-09-06 DIAGNOSIS — E876 Hypokalemia: Secondary | ICD-10-CM | POA: Diagnosis not present

## 2023-09-06 DIAGNOSIS — I5A Non-ischemic myocardial injury (non-traumatic): Secondary | ICD-10-CM | POA: Diagnosis not present

## 2023-09-06 DIAGNOSIS — I5181 Takotsubo syndrome: Secondary | ICD-10-CM | POA: Diagnosis not present

## 2023-09-06 DIAGNOSIS — R9389 Abnormal findings on diagnostic imaging of other specified body structures: Secondary | ICD-10-CM | POA: Diagnosis not present

## 2023-09-06 DIAGNOSIS — L8990 Pressure ulcer of unspecified site, unspecified stage: Secondary | ICD-10-CM | POA: Insufficient documentation

## 2023-09-06 DIAGNOSIS — E1142 Type 2 diabetes mellitus with diabetic polyneuropathy: Secondary | ICD-10-CM | POA: Diagnosis present

## 2023-09-06 DIAGNOSIS — L89302 Pressure ulcer of unspecified buttock, stage 2: Secondary | ICD-10-CM | POA: Diagnosis present

## 2023-09-06 DIAGNOSIS — I10 Essential (primary) hypertension: Secondary | ICD-10-CM | POA: Diagnosis present

## 2023-09-06 DIAGNOSIS — F0390 Unspecified dementia without behavioral disturbance: Secondary | ICD-10-CM | POA: Diagnosis present

## 2023-09-06 DIAGNOSIS — I2489 Other forms of acute ischemic heart disease: Secondary | ICD-10-CM | POA: Diagnosis present

## 2023-09-06 DIAGNOSIS — E669 Obesity, unspecified: Secondary | ICD-10-CM | POA: Diagnosis present

## 2023-09-06 DIAGNOSIS — E119 Type 2 diabetes mellitus without complications: Secondary | ICD-10-CM

## 2023-09-06 DIAGNOSIS — E870 Hyperosmolality and hypernatremia: Secondary | ICD-10-CM | POA: Insufficient documentation

## 2023-09-06 DIAGNOSIS — E860 Dehydration: Secondary | ICD-10-CM | POA: Diagnosis present

## 2023-09-06 DIAGNOSIS — Z7982 Long term (current) use of aspirin: Secondary | ICD-10-CM | POA: Diagnosis not present

## 2023-09-06 DIAGNOSIS — N3000 Acute cystitis without hematuria: Secondary | ICD-10-CM | POA: Diagnosis not present

## 2023-09-06 DIAGNOSIS — Z7901 Long term (current) use of anticoagulants: Secondary | ICD-10-CM | POA: Diagnosis not present

## 2023-09-06 DIAGNOSIS — N12 Tubulo-interstitial nephritis, not specified as acute or chronic: Secondary | ICD-10-CM | POA: Diagnosis present

## 2023-09-06 DIAGNOSIS — I2699 Other pulmonary embolism without acute cor pulmonale: Secondary | ICD-10-CM | POA: Diagnosis present

## 2023-09-06 DIAGNOSIS — R079 Chest pain, unspecified: Secondary | ICD-10-CM | POA: Diagnosis not present

## 2023-09-06 DIAGNOSIS — R7989 Other specified abnormal findings of blood chemistry: Secondary | ICD-10-CM | POA: Diagnosis not present

## 2023-09-06 DIAGNOSIS — Z1152 Encounter for screening for COVID-19: Secondary | ICD-10-CM | POA: Diagnosis not present

## 2023-09-06 LAB — COMPREHENSIVE METABOLIC PANEL
ALT: 20 U/L (ref 0–44)
AST: 29 U/L (ref 15–41)
Albumin: 3.4 g/dL — ABNORMAL LOW (ref 3.5–5.0)
Alkaline Phosphatase: 53 U/L (ref 38–126)
Anion gap: 15 (ref 5–15)
BUN: 33 mg/dL — ABNORMAL HIGH (ref 8–23)
CO2: 23 mmol/L (ref 22–32)
Calcium: 9.5 mg/dL (ref 8.9–10.3)
Chloride: 107 mmol/L (ref 98–111)
Creatinine, Ser: 0.9 mg/dL (ref 0.44–1.00)
GFR, Estimated: >60 mL/min (ref >60)
Glucose, Bld: 106 mg/dL — ABNORMAL HIGH (ref 70–99)
Potassium: 4.1 mmol/L (ref 3.5–5.1)
Sodium: 145 mmol/L (ref 135–145)
Total Bilirubin: 0.9 mg/dL (ref <1.2)
Total Protein: 7.7 g/dL (ref 6.5–8.1)

## 2023-09-06 LAB — BLOOD GAS, VENOUS
Acid-Base Excess: 2.7 mmol/L — ABNORMAL HIGH (ref 0.0–2.0)
Bicarbonate: 27.9 mmol/L (ref 20.0–28.0)
Drawn by: 1528
O2 Saturation: 68.2 %
Patient temperature: 36.7
pCO2, Ven: 43 mm[Hg] — ABNORMAL LOW (ref 44–60)
pH, Ven: 7.41 (ref 7.25–7.43)
pO2, Ven: 40 mm[Hg] (ref 32–45)

## 2023-09-06 LAB — URINALYSIS, W/ REFLEX TO CULTURE (INFECTION SUSPECTED)
Bilirubin Urine: NEGATIVE
Glucose, UA: NEGATIVE mg/dL
Ketones, ur: 20 mg/dL — AB
Nitrite: NEGATIVE
Protein, ur: 30 mg/dL — AB
Specific Gravity, Urine: 1.019 (ref 1.005–1.030)
pH: 5 (ref 5.0–8.0)

## 2023-09-06 LAB — LACTIC ACID, PLASMA
Lactic Acid, Venous: 2.3 mmol/L (ref 0.5–1.9)
Lactic Acid, Venous: 1.4 mmol/L (ref 0.5–1.9)

## 2023-09-06 LAB — GLUCOSE, CAPILLARY: Glucose-Capillary: 103 mg/dL — ABNORMAL HIGH (ref 70–99)

## 2023-09-06 LAB — CBC
HCT: 46.8 % — ABNORMAL HIGH (ref 36.0–46.0)
Hemoglobin: 13.9 g/dL (ref 12.0–15.0)
MCH: 24.1 pg — ABNORMAL LOW (ref 26.0–34.0)
MCHC: 29.7 g/dL — ABNORMAL LOW (ref 30.0–36.0)
MCV: 81.3 fL (ref 80.0–100.0)
Platelets: 416 10*3/uL — ABNORMAL HIGH (ref 150–400)
RBC: 5.76 MIL/uL — ABNORMAL HIGH (ref 3.87–5.11)
RDW: 13.9 % (ref 11.5–15.5)
WBC: 11.3 10*3/uL — ABNORMAL HIGH (ref 4.0–10.5)
nRBC: 0 % (ref 0.0–0.2)

## 2023-09-06 LAB — HEMOGLOBIN A1C
Hgb A1c MFr Bld: 5.7 % — ABNORMAL HIGH (ref 4.8–5.6)
Mean Plasma Glucose: 116.89 mg/dL

## 2023-09-06 LAB — MRSA NEXT GEN BY PCR, NASAL: MRSA by PCR Next Gen: NOT DETECTED

## 2023-09-06 LAB — PHOSPHORUS: Phosphorus: 2.3 mg/dL — ABNORMAL LOW (ref 2.5–4.6)

## 2023-09-06 LAB — MAGNESIUM: Magnesium: 1.7 mg/dL (ref 1.7–2.4)

## 2023-09-06 MED ORDER — ENOXAPARIN SODIUM 40 MG/0.4ML IJ SOSY
40.0000 mg | PREFILLED_SYRINGE | INTRAMUSCULAR | Status: DC
  Administered 2023-09-06: 40 mg via SUBCUTANEOUS
  Filled 2023-09-06 (×2): qty 0.4

## 2023-09-06 MED ORDER — PROCHLORPERAZINE EDISYLATE 10 MG/2ML IJ SOLN: 5.0000 mg | Freq: Four times a day (QID) | INTRAMUSCULAR | Status: DC | PRN

## 2023-09-06 MED ORDER — POLYETHYLENE GLYCOL 3350 17 G PO PACK: 17.0000 g | PACK | Freq: Every day | ORAL | Status: DC | PRN

## 2023-09-06 MED ORDER — CEFTRIAXONE SODIUM 1 G IJ SOLR
1.0000 g | INTRAMUSCULAR | Status: DC
  Administered 2023-09-06 – 2023-09-10 (×5): 1 g via INTRAVENOUS
  Filled 2023-09-06 (×5): qty 10

## 2023-09-06 MED ORDER — CHLORHEXIDINE GLUCONATE CLOTH 2 % EX PADS
6.0000 | MEDICATED_PAD | Freq: Every day | CUTANEOUS | Status: DC
  Administered 2023-09-06: 6 via TOPICAL

## 2023-09-06 MED ORDER — ACETAMINOPHEN 325 MG PO TABS
650.0000 mg | ORAL_TABLET | Freq: Four times a day (QID) | ORAL | Status: DC | PRN
  Administered 2023-09-06 – 2023-09-08 (×2): 650 mg via ORAL
  Filled 2023-09-06 (×2): qty 2

## 2023-09-06 MED ORDER — MELATONIN 3 MG PO TABS: 6.0000 mg | ORAL_TABLET | Freq: Every evening | ORAL | Status: DC | PRN

## 2023-09-06 MED ORDER — SODIUM CHLORIDE 0.9 % IV BOLUS
1000.0000 mL | Freq: Once | INTRAVENOUS | Status: AC
  Administered 2023-09-06: 1000 mL via INTRAVENOUS

## 2023-09-06 MED ORDER — SODIUM CHLORIDE 0.9 % IV SOLN: INTRAVENOUS | Status: DC

## 2023-09-06 NOTE — Assessment & Plan Note (Signed)
Infiltrates on noncontrasted CT chest concerning for PE.  Possibly pneumonia. - Obtain Dimer - Continue antibiotics

## 2023-09-06 NOTE — Assessment & Plan Note (Signed)
Buttocks, medial stage II, present on admission - Daily wound care

## 2023-09-06 NOTE — Assessment & Plan Note (Signed)
Resolved

## 2023-09-06 NOTE — Assessment & Plan Note (Signed)
Thickening of the stomach indeterminate on CT without contrast - Obtain CT with contrast

## 2023-09-06 NOTE — Assessment & Plan Note (Signed)
P/w AMS, weakness, leukocytosis, tachcycardia, and pyruria.   Culture no growth to date - Continue Rocephin day 3 - Follow blood and urine cultures

## 2023-09-06 NOTE — H&P (Addendum)
History and Physical  Michelle Mcclain CWC:[id number] DOB: 1941/06/30 DOA: 09/05/2023  Referring physician: Dr. Clayborne Dana, EDP  PCP: Butch Penny, MD (Inactive)  Outpatient Specialists: None Patient coming from: Home  Chief Complaint: Altered mental status.  HPI: Michelle Mcclain is a 82 y.o. female with medical history significant for hypertension, hyperlipidemia, type 2 diabetes, diabetic polyneuropathy, who presented to the ED due to altered mental status.  Reportedly the patient has been laying in bed for the past 3 days soaked in urine.  She would not allow family members to change her due to pain in her legs.  EMS was activated.  Upon arrival to the ED, the patient was noted to be tachycardic and volume depleted.  Lab studies notable for lactic acidosis 3.6, leukocytosis 13,000, urine positive for pyuria.  Code sepsis was called in the ED.  The patient was started on empiric IV antibiotic Rocephin.  Admitted by [facility], hospitalist service.  ED Course: Temperature 98.1.  BP 114/87, pulse 113, respiratory rate 13, saturation 98% on room air.  Lab studies notable for VBG pH 7.41, pCO2 43.  WBC 13.2, platelet count 419.  Serum sodium 146, bicarb 20, glucose 145, creatinine 1.36, BUN 48.  T. bili 1.2, GFR 39.  Lactic acid 3.6, 2.9.  Review of Systems: Review of systems as noted in the HPI. All other systems reviewed and are negative.   Past Medical History:  Diagnosis Date   Hypercholesteremia    Hypertension    Seasonal allergies    Past Surgical History:  Procedure Laterality Date   ABDOMINAL HYSTERECTOMY     COLONOSCOPY     COLONOSCOPY N/A 01/09/2015   Procedure: COLONOSCOPY;  Surgeon: Malissa Hippo, MD;  Location: [facility];  Service: Endoscopy;  Laterality: N/A;  830    Social History:  reports that she has never smoked. She does not have any smokeless tobacco history on file. She reports that she does not drink alcohol and does not use drugs.   No Known  Allergies  Family history: None reported.  Prior to Admission medications   Medication Sig Start Date End Date Taking? Authorizing Provider  atorvastatin (LIPITOR) 80 MG tablet Take 80 mg by mouth daily. 09/04/23  Yes [provider]  hydrochlorothiazide (HYDRODIURIL) 50 MG tablet Take 50 mg by mouth daily. 08/04/23  Yes [provider]  losartan (COZAAR) 100 MG tablet Take 100 mg by mouth daily. 09/04/23  Yes [provider]  memantine (NAMENDA) 10 MG tablet Take 10 mg by mouth 2 (two) times daily. 07/01/23  Yes [provider]  metFORMIN (GLUCOPHAGE) 1000 MG tablet Take 1,000 mg by mouth 2 (two) times daily. 08/04/23  Yes [provider]  metoprolol succinate (TOPROL-XL) 25 MG 24 hr tablet Take 25 mg by mouth daily. 06/30/23  Yes [provider]  Multiple Vitamins-Minerals (MULTIVITAMINS THER. W/MINERALS) TABS tablet Take 1 tablet by mouth daily.   Yes [provider]  pregabalin (LYRICA) 75 MG capsule Take 75 mg by mouth 2 (two) times daily. 07/04/23  Yes [provider]    Physical Exam: BP 114/87   Pulse 89   Temp 98.1 F (36.7 C) (Rectal)   Resp 16   Ht 5\' 8"  (1.727 m)   Wt 97 kg   SpO2 100%   BMI 32.52 kg/m   General: 82 y.o. year-old female well developed well nourished in no acute distress.  Alert and oriented x1. Cardiovascular: Regular rate and rhythm with no rubs or gallops.  No thyromegaly or JVD noted.  Trace lower extremity edema bilaterally. Respiratory: Clear to auscultation with no wheezes or rales. Good inspiratory effort. Abdomen: Soft nontender nondistended with normal bowel sounds x4 quadrants. Muskuloskeletal: No cyanosis or clubbing noted bilaterally Neuro: CN II-XII intact, strength, sensation, reflexes Skin: No ulcerative lesions noted or rashes Psychiatry: Judgement and insight appear altered. Mood is appropriate for condition and setting          Labs on Admission:  Basic Metabolic  Panel: Recent Labs  Lab 09/05/23 1616  NA 146*  K 4.5  CL 101  CO2 20*  GLUCOSE 145*  BUN 48*  CREATININE 1.36*  CALCIUM 10.7*  MG 2.2   Liver Function Tests: Recent Labs  Lab 09/05/23 1616  AST 31  ALT 24  ALKPHOS 64  BILITOT 1.2*  PROT 9.3*  ALBUMIN 4.1   No results for input(s): "LIPASE", "AMYLASE" in the last 168 hours. No results for input(s): "AMMONIA" in the last 168 hours. CBC: Recent Labs  Lab 09/05/23 1758  WBC 13.2*  NEUTROABS 9.1*  HGB 14.1  HCT 48.2*  MCV 83.4  PLT 419*   Cardiac Enzymes: No results for input(s): "CKTOTAL", "CKMB", "CKMBINDEX", "TROPONINI" in the last 168 hours.  BNP (last 3 results) No results for input(s): "BNP" in the last 8760 hours.  ProBNP (last 3 results) No results for input(s): "PROBNP" in the last 8760 hours.  CBG: No results for input(s): "GLUCAP" in the last 168 hours.  Radiological Exams on Admission: CT CHEST ABDOMEN PELVIS WO CONTRAST  Result Date: 09/05/2023 CLINICAL DATA:  Sepsis, dementia EXAM: CT CHEST, ABDOMEN AND PELVIS WITHOUT CONTRAST TECHNIQUE: Multidetector CT imaging of the chest, abdomen and pelvis was performed following the standard protocol without IV contrast. RADIATION DOSE REDUCTION: This exam was performed according to the departmental dose-optimization program which includes automated exposure control, adjustment of the mA and/or kV according to patient size and/or use of iterative reconstruction technique. COMPARISON:  None Available. FINDINGS: CT CHEST FINDINGS Cardiovascular: The heart is normal in size. No pericardial effusion. No evidence of thoracic aortic aneurysm. Atherosclerotic calcifications of the aortic arch. Severe three-coronary atherosclerosis. Mediastinum/Nodes: No suspicious mediastinal lymphadenopathy. Subcentimeter calcified left thyroid nodules, benign. No follow-up is recommended. Lungs/Pleura: Mild subpleural ground-glass opacity with central low-density at the lateral right  lung base (series 4/image 108). This appearance is nonspecific but at least raises the possibility of an early pulmonary infarct. Mild bibasilar atelectasis/scarring. No suspicious pulmonary nodules. No pleural effusion or pneumothorax. Musculoskeletal: Degenerative changes of the mid thoracic spine. CT ABDOMEN PELVIS FINDINGS Hepatobiliary: Unenhanced liver is unremarkable. Gallbladder is unremarkable. No intrahepatic or extrahepatic duct dilatation. Pancreas: Within normal limits. Spleen: Within normal limits but Adrenals/Urinary Tract: Adrenal glands are within normal limits. Left kidney is within normal limits. Right renal cysts measuring up to 2.0 cm (series 3/image 69), benign (Bosniak I). No follow-up is recommended. No renal, ureteral, or bladder calculi.  No hydronephrosis. Bladder is within normal limits. Stomach/Bowel: Stomach is notable for a tiny hiatal hernia. Soft tissue prominence at the gastric cardia (series 3/image 51), poorly evaluated on unenhanced CT, but at least raising the possibility of a mass. No evidence of bowel obstruction. Appendix is not discretely visualized. Extensive sigmoid diverticulosis, without evidence of diverticulitis. Vascular/Lymphatic: No evidence of abdominal aortic aneurysm. Atherosclerotic calcifications of the abdominal aorta and branch vessels. No suspicious abdominopelvic lymphadenopathy. Reproductive: Status post hysterectomy. No adnexal masses. Other: No abdominopelvic ascites. Musculoskeletal: Mild degenerative changes at L5-S1. IMPRESSION: Focal ground-glass opacity at  the right lung base, nonspecific. However, early pulmonary infarct could have this appearance. Consider PE protocol CT for further evaluation, as clinically warranted. Soft tissue prominence at the gastric cardia, poorly evaluated on unenhanced CT, but at least raising the possibility of a mass. Consider CT abdomen with contrast for further evaluation. Extensive sigmoid diverticulosis, without  evidence of diverticulitis. Aortic Atherosclerosis (ICD10-I70.0). Electronically Signed   By: Charline Bills M.D.   On: 09/05/2023 20:56   CT Head Wo Contrast  Result Date: 09/05/2023 CLINICAL DATA:  Altered mental status. EXAM: CT HEAD WITHOUT CONTRAST TECHNIQUE: Contiguous axial images were obtained from the base of the skull through the vertex without intravenous contrast. RADIATION DOSE REDUCTION: This exam was performed according to the departmental dose-optimization program which includes automated exposure control, adjustment of the mA and/or kV according to patient size and/or use of iterative reconstruction technique. COMPARISON:  None Available. FINDINGS: Brain: Mild age-related atrophy and chronic microvascular ischemic changes. There is no acute intracranial hemorrhage. No mass effect or midline shift. No extra-axial fluid collection. Vascular: No hyperdense vessel or unexpected calcification. Skull: Normal. Negative for fracture or focal lesion. Sinuses/Orbits: No acute finding. Other: None IMPRESSION: 1. No acute intracranial pathology. 2. Mild age-related atrophy and chronic microvascular ischemic changes. Electronically Signed   By: Elgie Collard M.D.   On: 09/05/2023 20:45   DG Chest Port 1 View  Result Date: 09/05/2023 CLINICAL DATA:  Questionable sepsis - evaluate for abnormality EXAM: PORTABLE CHEST 1 VIEW COMPARISON:  None Available. FINDINGS: Normal heart size. Normal mediastinal contour. No pneumothorax. No pleural effusion. Lungs appear clear, with no acute consolidative airspace disease and no pulmonary edema. IMPRESSION: No active disease. Electronically Signed   By: Delbert Phenix M.D.   On: 09/05/2023 17:44    EKG: I independently viewed the EKG done and my findings are as followed: Sinus rhythm rate of 95.  Nonspecific ST-T changes.  QTc 496.  Assessment/Plan Present on Admission:  Sepsis secondary to UTI [facility])  Principal Problem:   Sepsis secondary to UTI  (HCC)  Sepsis secondary to UTI, POA Presented with leukocytosis, tachycardia, tachypnea, lactic acidosis 3.6 UA positive for pyuria, started on Rocephin Follow urine culture and peripheral blood cultures x 2 for ID and sensitivities Monitor fever curve and WBCs. IV fluid hydration  Lactic acidosis, suspect multifactorial secondary to sepsis and dehydration Initial lactic acid level 3.6, improved to 2.9 after IV fluid hydration Continue IV fluid Trend lactic acid. Continue to treat underlying conditions.  Acute metabolic encephalopathy secondary to sepsis Continue to treat underlying conditions Reorient as needed Fall precautions  Bilateral lower extremity edema, pain and tenderness with palpation Obtain duplex ultrasound to rule out DVT due to inactivity for the past 3 days Elevate lower extremities As needed analgesics  Obesity BMI 32 Recommend weight loss outpatient with regular physical activity and healthy dieting.  Incidental findings on CT scan Soft tissue prominence at the gastric cardia, poorly evaluated on unenhanced CT, but at least raising the possibility of a mass. Consider CT abdomen with contrast for further evaluation. Consider further workup after improvement of renal function from IV fluid hydration   Critical care time: 55 minutes.   DVT prophylaxis: Subcu Lovenox daily  Code Status: Full code  Family Communication: None at bedside.  Disposition Plan: Admitted to telemetry unit  Consults called: None.  Admission status: Inpatient status.   Status is: Inpatient The patient requires at least 2 midnights for further evaluation and treatment of present condition.  Darlin Drop MD [facility] Pager [phone]  If 7PM-7AM, please contact night-coverage [email or web address] Password [facility]  09/06/2023, 3:53 AM

## 2023-09-06 NOTE — TOC Initial Note (Signed)
Transition of Care [facility]) - Initial/Assessment Note    Patient Details  Name: Michelle Mcclain MRN: [id number] Date of Birth: [birth year]  Transition of [facility]) CM/SW Contact:    Elliot Gault, LCSW Phone Number: 09/06/2023, 11:45 AM  Clinical Narrative:                  Pt admitted from home with family. Anticipating pt will have HH/DME needs at dc.   Spoke with pt/dtr at bedside this AM to assess. Pt's husband and their daughter provide care for pt who has dementia. Dtr states that in the last several days, pt has complained of increasing leg pain and she was refusing all care due to pain.  Pt only has a walker at home but she recently received it and hasn't been using it because she has been refusing to walk at all due to the pain. Pt does not have any other DME. Dtr thinks it would be beneficial to have a hospital bed at home especially if pt unable to ambulate upon dc.  If pt requires HH, dtr agreeable and TOC will arrange along with any other DME needed. Dtr also agreeable to outpatient palliative referral at dc. CMS provider options reviewed. Will refer as requested.  TOC will follow and continue to assess and assist with dc planning.  Expected Discharge Plan: Home w Home Health Services Barriers to Discharge: Continued Medical Work up   Patient Goals and CMS Choice Patient states their goals for this hospitalization and ongoing recovery are:: go home          Expected Discharge Plan and Services In-house Referral: Clinical Social Work     Living arrangements for the past 2 months: Single Family Home                                      Prior Living Arrangements/Services Living arrangements for the past 2 months: Single Family Home Lives with:: Adult Children, Spouse Patient language and need for interpreter reviewed:: Yes Do you feel safe going back to the place where you live?: Yes      Need for Family Participation in Patient Care: Yes  (Comment) Care giver support system in place?: Yes (comment) Current home services: DME Criminal Activity/Legal Involvement Pertinent to Current Situation/Hospitalization: No - Comment as needed  Activities of Daily Living   ADL Screening (condition at time of admission) Independently performs ADLs?: No Does the patient have a NEW difficulty with bathing/dressing/toileting/self-feeding that is expected to last >3 days?: No Does the patient have a NEW difficulty with getting in/out of bed, walking, or climbing stairs that is expected to last >3 days?: No Does the patient have a NEW difficulty with communication that is expected to last >3 days?: No Is the patient deaf or have difficulty hearing?: No Does the patient have difficulty seeing, even when wearing glasses/contacts?: No Does the patient have difficulty concentrating, remembering, or making decisions?: Yes  Permission Sought/Granted                  Emotional Assessment Appearance:: Appears stated age Attitude/Demeanor/Rapport: Gracious Affect (typically observed): Stable Orientation: : Oriented to Self Alcohol / Substance Use: Not Applicable Psych Involvement: No (comment)  Admission diagnosis:  Dehydration [E86.0] Sepsis secondary to UTI (HCC) [A41.9, N39.0] Cystitis [N30.90] Patient Active Problem List   Diagnosis Date Noted   Sepsis secondary to UTI (HCC) 09/06/2023  Abnormal CT of the abdomen 09/06/2023   Hypernatremia 09/06/2023   Hypercalcemia 09/06/2023   AKI (acute kidney injury) (HCC) 09/06/2023   Essential hypertension 09/06/2023   Mixed hyperlipidemia 09/06/2023   Controlled type 2 diabetes mellitus without complication, without long-term current use of insulin (HCC) 09/06/2023   Obesity (BMI 30-39.9) 09/06/2023   Acute metabolic encephalopathy 09/06/2023   PCP:  Butch Penny, MD (Inactive) Pharmacy:   [facility] [address], [place] - [address] [place] [address] [place]  [place] [address] Phone: [phone] Fax: [phone]     Social Determinants of Health (SDOH) Social History: SDOH Screenings   Food Insecurity: No Food Insecurity (09/06/2023)  Housing: Low Risk  (09/06/2023)  Transportation Needs: No Transportation Needs (09/06/2023)  Utilities: Not At Risk (09/06/2023)  Tobacco Use: Unknown (09/05/2023)   SDOH Interventions:     Readmission Risk Interventions     No data to display

## 2023-09-06 NOTE — Assessment & Plan Note (Signed)
Lives at home with daughter and husband.  At baseline oriented to self and place but not time.  Prior to admission and still (per daughter) patient is more disoriented to situation and more forgetful and confused than baseline.  No focal deficits to suggest stroke.  Appaers as delirium from underlying medical issues. - Hold Lyrica - Resume memantine - Standard delirium precautions: blinds open and lights on during day, TV off, minimize interruptions at night, glasses/hearing aids, PT/OT, avoiding Beers list medications

## 2023-09-06 NOTE — Assessment & Plan Note (Signed)
 Resume Lipitor

## 2023-09-06 NOTE — ED Notes (Signed)
ED TO INPATIENT HANDOFF REPORT  ED Nurse Name and Phone #: [phone]   S Name/Age/Gender Michelle Mcclain 82 y.o. female Room/Bed: APA03/APA03  Code Status   Code Status: Full Code  Home/SNF/Other Home Patient oriented to: self and place Is this baseline? Yes   Triage Complete: Triage complete  Chief Complaint Sepsis secondary to UTI (HCC) [A41.9, N39.0]  Triage Note Pt from home and has been laying in the bed since Friday, soaked in urine. Family member takes care of pt but pt would not allow caregiver to change her d/t pain in legs.  Pt has dementia.    Allergies No Known Allergies  Level of Care/Admitting Diagnosis ED Disposition     ED Disposition  Admit   Condition  --   Comment  Hospital Area: [facility] [[id number]]  Level of Care: Telemetry [5]  Covid Evaluation: Asymptomatic - no recent exposure (last 10 days) testing not required  Diagnosis: Sepsis secondary to UTI [facility]) [[id number]]  Admitting Physician: Darlin Drop [[id number]]  Attending Physician: Darlin Drop [[id number]]  Certification:: I certify this patient will need inpatient services for at least 2 midnights  Expected Medical Readiness: 09/08/2023          B Medical/Surgery History Past Medical History:  Diagnosis Date   Hypercholesteremia    Hypertension    Seasonal allergies    Past Surgical History:  Procedure Laterality Date   ABDOMINAL HYSTERECTOMY     COLONOSCOPY     COLONOSCOPY N/A 01/09/2015   Procedure: COLONOSCOPY;  Surgeon: Malissa Hippo, MD;  Location: [facility];  Service: Endoscopy;  Laterality: N/A;  830     A IV Location/Drains/Wounds Patient Lines/Drains/Airways Status     Active Line/Drains/Airways     Name Placement date Placement time Site Days   Peripheral IV 09/06/23 20 G Anterior;Proximal;Right Forearm 09/06/23  0022  Forearm  less than 1            Intake/Output Last 24 hours  Intake/Output Summary (Last 24 hours) at 09/06/2023  0553 Last data filed at 09/05/2023 1805 Gross per 24 hour  Intake 999 ml  Output --  Net 999 ml    Labs/Imaging Results for orders placed or performed during the hospital encounter of 09/05/23 (from the past 48 hour(s))  Resp panel by RT-PCR (RSV, Flu A&B, Covid) Anterior Nasal Swab     Status: None   Collection Time: 09/05/23  4:01 PM   Specimen: Anterior Nasal Swab  Result Value Ref Range   SARS Coronavirus 2 by RT PCR NEGATIVE NEGATIVE    Comment: (NOTE) SARS-CoV-2 target nucleic acids are NOT DETECTED.  The SARS-CoV-2 RNA is generally detectable in upper respiratory specimens during the acute phase of infection. The lowest concentration of SARS-CoV-2 viral copies this assay can detect is 138 copies/mL. A negative result does not preclude SARS-Cov-2 infection and should not be used as the sole basis for treatment or other patient management decisions. A negative result may occur with  improper specimen collection/handling, submission of specimen other than nasopharyngeal swab, presence of viral mutation(s) within the areas targeted by this assay, and inadequate number of viral copies(<138 copies/mL). A negative result must be combined with clinical observations, patient history, and epidemiological information. The expected result is Negative.  Fact Sheet for Patients:  BloggerCourse.com  Fact Sheet for Healthcare Providers:  SeriousBroker.it  This test is no t yet approved or cleared by the [place] FDA and  has been authorized for detection and/or  diagnosis of SARS-CoV-2 by FDA under an Emergency Use Authorization (EUA). This EUA will remain  in effect (meaning this test can be used) for the duration of the COVID-19 declaration under Section 564(b)(1) of the Act, 21 U.S.C.section 360bbb-3(b)(1), unless the authorization is terminated  or revoked sooner.       Influenza A by PCR NEGATIVE NEGATIVE   Influenza B by  PCR NEGATIVE NEGATIVE    Comment: (NOTE) The Xpert Xpress SARS-CoV-2/FLU/RSV plus assay is intended as an aid in the diagnosis of influenza from Nasopharyngeal swab specimens and should not be used as a sole basis for treatment. Nasal washings and aspirates are unacceptable for Xpert Xpress SARS-CoV-2/FLU/RSV testing.  Fact Sheet for Patients: BloggerCourse.com  Fact Sheet for Healthcare Providers: SeriousBroker.it  This test is not yet approved or cleared by the [place] FDA and has been authorized for detection and/or diagnosis of SARS-CoV-2 by FDA under an Emergency Use Authorization (EUA). This EUA will remain in effect (meaning this test can be used) for the duration of the COVID-19 declaration under Section 564(b)(1) of the Act, 21 U.S.C. section 360bbb-3(b)(1), unless the authorization is terminated or revoked.     Resp Syncytial Virus by PCR NEGATIVE NEGATIVE    Comment: (NOTE) Fact Sheet for Patients: BloggerCourse.com  Fact Sheet for Healthcare Providers: SeriousBroker.it  This test is not yet approved or cleared by the [place] FDA and has been authorized for detection and/or diagnosis of SARS-CoV-2 by FDA under an Emergency Use Authorization (EUA). This EUA will remain in effect (meaning this test can be used) for the duration of the COVID-19 declaration under Section 564(b)(1) of the Act, 21 U.S.C. section 360bbb-3(b)(1), unless the authorization is terminated or revoked.  Performed at [facility], [address] [place], [place] [address]   Urinalysis, w/ Reflex to Culture (Infection Suspected) -Urine, Clean Catch     Status: Abnormal   Collection Time: 09/05/23  4:01 PM  Result Value Ref Range   Specimen Source URINE, CATHETERIZED     Comment: CORRECTED ON 12/09 AT 1621: PREVIOUSLY REPORTED AS URINE, CLEAN CATCH   Color, Urine YELLOW YELLOW    APPearance CLOUDY (A) CLEAR   Specific Gravity, Urine 1.021 1.005 - 1.030   pH 5.0 5.0 - 8.0   Glucose, UA NEGATIVE NEGATIVE mg/dL   Hgb urine dipstick LARGE (A) NEGATIVE   Bilirubin Urine NEGATIVE NEGATIVE   Ketones, ur 20 (A) NEGATIVE mg/dL   Protein, ur 30 (A) NEGATIVE mg/dL   Nitrite NEGATIVE NEGATIVE   Leukocytes,Ua MODERATE (A) NEGATIVE   RBC / HPF 21-50 0 - 5 RBC/hpf   WBC, UA 21-50 0 - 5 WBC/hpf    Comment:        Reflex urine culture not performed if WBC <=10, OR if Squamous epithelial cells >5. If Squamous epithelial cells >5 suggest recollection.    Bacteria, UA FEW (A) NONE SEEN   Squamous Epithelial / HPF 6-10 0 - 5 /HPF   Mucus PRESENT    Amorphous Crystal PRESENT     Comment: Performed at [facility], [address] [place], [place] [address]  Comprehensive metabolic panel     Status: Abnormal   Collection Time: 09/05/23  4:16 PM  Result Value Ref Range   Sodium 146 (H) 135 - 145 mmol/L   Potassium 4.5 3.5 - 5.1 mmol/L   Chloride 101 98 - 111 mmol/L   CO2 20 (L) 22 - 32 mmol/L   Glucose, Bld 145 (H) 70 - 99 mg/dL  Comment: Glucose reference range applies only to samples taken after fasting for at least 8 hours.   BUN 48 (H) 8 - 23 mg/dL   Creatinine, Ser 9.52 (H) 0.44 - 1.00 mg/dL   Calcium 84.1 (H) 8.9 - 10.3 mg/dL   Total Protein 9.3 (H) 6.5 - 8.1 g/dL   Albumin 4.1 3.5 - 5.0 g/dL   AST 31 15 - 41 U/L   ALT 24 0 - 44 U/L   Alkaline Phosphatase 64 38 - 126 U/L   Total Bilirubin 1.2 (H) <1.2 mg/dL   GFR, Estimated 39 (L) >60 mL/min    Comment: (NOTE) Calculated using the CKD-EPI Creatinine Equation (2021)    Anion gap 25 (H) 5 - 15    Comment: Electrolytes repeated to confirm. Electrolytes repeated to confirm. Performed at [facility], [address] [place], [place] [address]   Blood Culture (routine x 2)     Status: None (Preliminary result)   Collection Time: 09/05/23  4:16 PM   Specimen: Blood  Result Value Ref Range   Specimen Description  BLOOD RIGHT ANTECUBITAL    Special Requests      BOTTLES DRAWN AEROBIC AND ANAEROBIC Blood Culture adequate volume Performed at [facility], [address] [place], [place] [address]    Culture PENDING    Report Status PENDING   Troponin I (High Sensitivity)     Status: None   Collection Time: 09/05/23  4:16 PM  Result Value Ref Range   Troponin I (High Sensitivity) 4 <18 ng/L    Comment: (NOTE) Elevated high sensitivity troponin I (hsTnI) values and significant  changes across serial measurements may suggest ACS but many other  chronic and acute conditions are known to elevate hsTnI results.  Refer to the "Links" section for chest pain algorithms and additional  guidance. Performed at [facility], [address] [place], [place] [address]   Magnesium     Status: None   Collection Time: 09/05/23  4:16 PM  Result Value Ref Range   Magnesium 2.2 1.7 - 2.4 mg/dL    Comment: Performed at [facility], [address] [place], [place] [address]  Blood Culture (routine x 2)     Status: None (Preliminary result)   Collection Time: 09/05/23  5:50 PM   Specimen: Blood  Result Value Ref Range   Specimen Description LEFT ANTECUBITAL    Special Requests      BOTTLES DRAWN AEROBIC ONLY Blood Culture results may not be optimal due to an inadequate volume of blood received in culture bottles Performed at [facility], [address], [place], [place] [address]    Culture PENDING    Report Status PENDING   Lactic acid, plasma     Status: Abnormal   Collection Time: 09/05/23  5:58 PM  Result Value Ref Range   Lactic Acid, Venous 3.6 (HH) 0.5 - 1.9 mmol/L    Comment: CRITICAL RESULT CALLED TO, READ BACK BY AND VERIFIED WITH BELTON,C ON 09/05/23 AT 1957 BY LOY,C Performed at [facility], [address] [place], [place] [address]   CBC with Differential/Platelet     Status: Abnormal   Collection Time: 09/05/23  5:58 PM  Result Value Ref Range   WBC 13.2 (H) 4.0 - 10.5 K/uL   RBC 5.78 (H)  3.87 - 5.11 MIL/uL   Hemoglobin 14.1 12.0 - 15.0 g/dL   HCT 64.3 (H) 32.9 - 51.8 %   MCV 83.4 80.0 - 100.0 fL   MCH 24.4 (L)  26.0 - 34.0 pg   MCHC 29.3 (L) 30.0 - 36.0 g/dL   RDW 16.1 09.6 - 04.5 %   Platelets 419 (H) 150 - 400 K/uL   nRBC 0.0 0.0 - 0.2 %   Neutrophils Relative % 70 %   Neutro Abs 9.1 (H) 1.7 - 7.7 K/uL   Lymphocytes Relative 20 %   Lymphs Abs 2.7 0.7 - 4.0 K/uL   Monocytes Relative 9 %   Monocytes Absolute 1.2 (H) 0.1 - 1.0 K/uL   Eosinophils Relative 1 %   Eosinophils Absolute 0.1 0.0 - 0.5 K/uL   Basophils Relative 0 %   Basophils Absolute 0.0 0.0 - 0.1 K/uL   Immature Granulocytes 0 %   Abs Immature Granulocytes 0.05 0.00 - 0.07 K/uL    Comment: Performed at [facility], [address] [place], [place] [address]  Troponin I (High Sensitivity)     Status: None   Collection Time: 09/05/23  5:58 PM  Result Value Ref Range   Troponin I (High Sensitivity) 8 <18 ng/L    Comment: (NOTE) Elevated high sensitivity troponin I (hsTnI) values and significant  changes across serial measurements may suggest ACS but many other  chronic and acute conditions are known to elevate hsTnI results.  Refer to the "Links" section for chest pain algorithms and additional  guidance. Performed at [facility], [address] [place], [place] [address]   Lactic acid, plasma     Status: Abnormal   Collection Time: 09/05/23  7:52 PM  Result Value Ref Range   Lactic Acid, Venous 2.9 (HH) 0.5 - 1.9 mmol/L    Comment: CRITICAL VALUE NOTED.  VALUE IS CONSISTENT WITH PREVIOUSLY REPORTED AND CALLED VALUE. Performed at [facility], [address] [place], [place] [address]   Beta-hydroxybutyric acid     Status: Abnormal   Collection Time: 09/05/23  7:52 PM  Result Value Ref Range   Beta-Hydroxybutyric Acid 3.75 (H) 0.05 - 0.27 mmol/L    Comment: Performed at [facility], [address], [place], [place] [address]  Salicylate level     Status: Abnormal   Collection Time: 09/05/23   7:52 PM  Result Value Ref Range   Salicylate Lvl <7.0 (L) 7.0 - 30.0 mg/dL    Comment: Performed at [facility], [address] [place], [place] [address]  Acetaminophen level     Status: Abnormal   Collection Time: 09/05/23  7:52 PM  Result Value Ref Range   Acetaminophen (Tylenol), Serum <10 (L) 10 - 30 ug/mL    Comment: (NOTE) Therapeutic concentrations vary significantly. A range of 10-30 ug/mL  may be an effective concentration for many patients. However, some  are best treated at concentrations outside of this range. Acetaminophen concentrations >150 ug/mL at 4 hours after ingestion  and >50 ug/mL at 12 hours after ingestion are often associated with  toxic reactions.  Performed at [facility], [address] [place], [place] [address]   Blood gas, venous     Status: Abnormal   Collection Time: 09/06/23  1:18 AM  Result Value Ref Range   pH, Ven 7.41 7.25 - 7.43   pCO2, Ven 43 (L) 44 - 60 mmHg   pO2, Ven 40 32 - 45 mmHg   Bicarbonate 27.9 20.0 - 28.0 mmol/L   Acid-Base Excess 2.7 (H) 0.0 - 2.0 mmol/L   O2 Saturation 68.2 %   Patient temperature 36.7    Collection site BLOOD LEFT HAND    Drawn by [id number]  Comment: Performed at [facility], [address], [place], [place] [address]   CT CHEST ABDOMEN PELVIS WO CONTRAST  Result Date: 09/05/2023 CLINICAL DATA:  Sepsis, dementia EXAM: CT CHEST, ABDOMEN AND PELVIS WITHOUT CONTRAST TECHNIQUE: Multidetector CT imaging of the chest, abdomen and pelvis was performed following the standard protocol without IV contrast. RADIATION DOSE REDUCTION: This exam was performed according to the departmental dose-optimization program which includes automated exposure control, adjustment of the mA and/or kV according to patient size and/or use of iterative reconstruction technique. COMPARISON:  None Available. FINDINGS: CT CHEST FINDINGS Cardiovascular: The heart is normal in size. No pericardial effusion. No evidence of thoracic aortic aneurysm.  Atherosclerotic calcifications of the aortic arch. Severe three-coronary atherosclerosis. Mediastinum/Nodes: No suspicious mediastinal lymphadenopathy. Subcentimeter calcified left thyroid nodules, benign. No follow-up is recommended. Lungs/Pleura: Mild subpleural ground-glass opacity with central low-density at the lateral right lung base (series 4/image 108). This appearance is nonspecific but at least raises the possibility of an early pulmonary infarct. Mild bibasilar atelectasis/scarring. No suspicious pulmonary nodules. No pleural effusion or pneumothorax. Musculoskeletal: Degenerative changes of the mid thoracic spine. CT ABDOMEN PELVIS FINDINGS Hepatobiliary: Unenhanced liver is unremarkable. Gallbladder is unremarkable. No intrahepatic or extrahepatic duct dilatation. Pancreas: Within normal limits. Spleen: Within normal limits but Adrenals/Urinary Tract: Adrenal glands are within normal limits. Left kidney is within normal limits. Right renal cysts measuring up to 2.0 cm (series 3/image 69), benign (Bosniak I). No follow-up is recommended. No renal, ureteral, or bladder calculi.  No hydronephrosis. Bladder is within normal limits. Stomach/Bowel: Stomach is notable for a tiny hiatal hernia. Soft tissue prominence at the gastric cardia (series 3/image 51), poorly evaluated on unenhanced CT, but at least raising the possibility of a mass. No evidence of bowel obstruction. Appendix is not discretely visualized. Extensive sigmoid diverticulosis, without evidence of diverticulitis. Vascular/Lymphatic: No evidence of abdominal aortic aneurysm. Atherosclerotic calcifications of the abdominal aorta and branch vessels. No suspicious abdominopelvic lymphadenopathy. Reproductive: Status post hysterectomy. No adnexal masses. Other: No abdominopelvic ascites. Musculoskeletal: Mild degenerative changes at L5-S1. IMPRESSION: Focal ground-glass opacity at the right lung base, nonspecific. However, early pulmonary infarct  could have this appearance. Consider PE protocol CT for further evaluation, as clinically warranted. Soft tissue prominence at the gastric cardia, poorly evaluated on unenhanced CT, but at least raising the possibility of a mass. Consider CT abdomen with contrast for further evaluation. Extensive sigmoid diverticulosis, without evidence of diverticulitis. Aortic Atherosclerosis (ICD10-I70.0). Electronically Signed   By: Charline Bills M.D.   On: 09/05/2023 20:56   CT Head Wo Contrast  Result Date: 09/05/2023 CLINICAL DATA:  Altered mental status. EXAM: CT HEAD WITHOUT CONTRAST TECHNIQUE: Contiguous axial images were obtained from the base of the skull through the vertex without intravenous contrast. RADIATION DOSE REDUCTION: This exam was performed according to the departmental dose-optimization program which includes automated exposure control, adjustment of the mA and/or kV according to patient size and/or use of iterative reconstruction technique. COMPARISON:  None Available. FINDINGS: Brain: Mild age-related atrophy and chronic microvascular ischemic changes. There is no acute intracranial hemorrhage. No mass effect or midline shift. No extra-axial fluid collection. Vascular: No hyperdense vessel or unexpected calcification. Skull: Normal. Negative for fracture or focal lesion. Sinuses/Orbits: No acute finding. Other: None IMPRESSION: 1. No acute intracranial pathology. 2. Mild age-related atrophy and chronic microvascular ischemic changes. Electronically Signed   By: Elgie Collard M.D.   On: 09/05/2023 20:45   DG Chest Port 1 View  Result Date: 09/05/2023 CLINICAL DATA:  Questionable sepsis - evaluate for abnormality EXAM: PORTABLE CHEST 1 VIEW COMPARISON:  None Available. FINDINGS: Normal heart size. Normal mediastinal contour. No pneumothorax. No pleural effusion. Lungs appear clear, with no acute consolidative airspace disease and no pulmonary edema. IMPRESSION: No active disease.  Electronically Signed   By: Delbert Phenix M.D.   On: 09/05/2023 17:44    Pending Labs Unresulted Labs (From admission, onward)     Start     Ordered   09/13/23 0500  Creatinine, serum  (enoxaparin (LOVENOX)    CrCl >/= 30 ml/min)  Weekly,   R     Comments: while on enoxaparin therapy    09/06/23 0350   09/06/23 0500  CBC  Tomorrow morning,   R        09/06/23 0459   09/06/23 0500  Comprehensive metabolic panel  Tomorrow morning,   R        09/06/23 0459   09/06/23 0500  Magnesium  Tomorrow morning,   R        09/06/23 0459   09/06/23 0500  Phosphorus  Tomorrow morning,   R        09/06/23 0459   09/06/23 0451  Lactic acid, plasma  (Lactic Acid)  STAT Now then every 3 hours,   R (with STAT occurrences)      09/06/23 0450   09/06/23 0449  Urinalysis, w/ Reflex to Culture (Infection Suspected) -Urine, Clean Catch  (Urine Labs)  ONCE - URGENT,   URGENT       Question:  Specimen Source  Answer:  Urine, Clean Catch   09/06/23 0448   09/06/23 0042  Hemoglobin A1c  Once,   R        09/06/23 0041            Vitals/Pain Today's Vitals   09/06/23 0012 09/06/23 0232 09/06/23 0345 09/06/23 0506  BP:      Pulse: (!) 113 89 88 94  Resp: 16     Temp:      TempSrc:      SpO2: 99% 100% 95% 98%  Weight:      Height:      PainSc:        Isolation Precautions No active isolations  Medications Medications  lactated ringers bolus 1,000 mL (1,000 mLs Intravenous Not Given 09/06/23 0025)  enoxaparin (LOVENOX) injection 40 mg (has no administration in time range)  0.9 %  sodium chloride infusion ( Intravenous New Bag/Given 09/06/23 0516)  acetaminophen (TYLENOL) tablet 650 mg (has no administration in time range)  prochlorperazine (COMPAZINE) injection 5 mg (has no administration in time range)  polyethylene glycol (MIRALAX / GLYCOLAX) packet 17 g (has no administration in time range)  melatonin tablet 6 mg (has no administration in time range)  cefTRIAXone (ROCEPHIN) 1 g in sodium  chloride 0.9 % 100 mL IVPB (has no administration in time range)  lactated ringers bolus 1,000 mL (0 mLs Intravenous Stopped 09/05/23 1805)  cefTRIAXone (ROCEPHIN) 1 g in sodium chloride 0.9 % 100 mL IVPB (0 g Intravenous Stopped 09/05/23 1837)  sodium chloride 0.9 % bolus 1,000 mL (0 mLs Intravenous Stopped 09/05/23 2316)  sodium chloride 0.9 % bolus 1,000 mL (0 mLs Intravenous Stopped 09/06/23 0353)         Focused Assessments Cardiac Assessment Handoff:    No results found for: "CKTOTAL", "CKMB", "CKMBINDEX", "TROPONINI" No results found for: "DDIMER" Does the Patient currently have chest pain? No    R Recommendations: See Admitting Provider  Note  Report given to:   Additional Notes:

## 2023-09-06 NOTE — Hospital Course (Addendum)
82 y.o. F with DM2, HTN, HLD and dementia, and diabetic polyneuropathy  presented with change in mentation, generalized weakness.  Reportedly the patient has been laying in bed for the past 3 days soaked in urine. She would not allow family members to change her due to pain in her legs. EMS was activated.  09/05/2023 urinalysis showed 21-50 WBC.  Urine culture was not initially done.  The patient was started on ceftriaxone on 09/05/2023 at 1807.  Urine culture was ultimately obtained on 09/06/2023 and showed <10K growth. Patient was started on intravenous ceftriaxone 09/05/23 at 1807.   12/11: New tachcyardia, troponin up 671>> 554, Cardiology consulted; CTA showed PE; Echo EF 30-35%,, RWMA Patient was started on IV heparin.  On 09/09/2023, the patient was transitioned to apixaban.  Decision was made to treat the patient medically without any invasive procedures.

## 2023-09-06 NOTE — Assessment & Plan Note (Signed)
 Resume home memantine.

## 2023-09-06 NOTE — Assessment & Plan Note (Signed)
Glucose controlled - Start SS corrections - Hold home metformin

## 2023-09-06 NOTE — Progress Notes (Signed)
    Patient: Michelle Mcclain ZOX:[id number] DOB: 10-27-1940      Brief hospital course: 82 y.o. F with DM, HTN, HLD and dementia, lives in memory care, presented with change in mentation, weakness, found to have UTI.     This is a no charge note, for further details, please see the H&P by my partner, Dr. Margo Aye from earlier today.         Assessment and Plan: * Sepsis secondary to UTI (HCC) - Continue Rocephin - Follow blood and urine cultures  Abnormal CT of the chest Infiltrates on noncontrasted CT chest concerning for PE.  Possibly pneumonia. - Obtain Dimer - Continue antibiotics  Pressure injury of skin Buttocks, medial stage II, present on admission - Daily wound care  Dementia without behavioral disturbance (HCC) - Hold home memantine  Acute metabolic encephalopathy - Hold Lyrica - Standard delirium precautions: blinds open and lights on during day, TV off, minimize interruptions at night, glasses/hearing aids, PT/OT, avoiding Beers list medications    Controlled type 2 diabetes mellitus without complication, without long-term current use of insulin (HCC) GLucose controlled - Okay to hold SS corrections for now - Hold home metformin  Mixed hyperlipidemia - Hold Lipitor   Essential hypertension BP normal - Hold hydrochlorothiazide and losartan and metoprolol for now  AKI (acute kidney injury) (HCC) Improved with IV fluids - Hold further fluids - Push oral fluids  Hypercalcemia Improved with IV fluids  Hypernatremia Improved with IV fluids  Abnormal CT of the abdomen Thickening of the stomach indeterminate on CT without contrast - Obtain CT with contrast           Physical Exam: BP 121/65   Pulse (!) 123   Temp 98.9 F (37.2 C) (Oral)   Resp 19   Ht 5\' 8"  (1.727 m)   Wt 75.8 kg   SpO2 99%   BMI 25.41 kg/m   Patient seen and examined.     Family Communication: None present        Author: Alberteen Sam,  MD 09/06/2023 5:03 PM

## 2023-09-06 NOTE — Assessment & Plan Note (Signed)
BP normal - Hold hydrochlorothiazide and losartan and metoprolol for now

## 2023-09-07 ENCOUNTER — Inpatient Hospital Stay (HOSPITAL_COMMUNITY): Payer: Medicare Other

## 2023-09-07 DIAGNOSIS — R9389 Abnormal findings on diagnostic imaging of other specified body structures: Secondary | ICD-10-CM

## 2023-09-07 DIAGNOSIS — G9341 Metabolic encephalopathy: Secondary | ICD-10-CM

## 2023-09-07 DIAGNOSIS — N390 Urinary tract infection, site not specified: Secondary | ICD-10-CM | POA: Diagnosis not present

## 2023-09-07 DIAGNOSIS — R935 Abnormal findings on diagnostic imaging of other abdominal regions, including retroperitoneum: Secondary | ICD-10-CM

## 2023-09-07 DIAGNOSIS — R7989 Other specified abnormal findings of blood chemistry: Secondary | ICD-10-CM | POA: Diagnosis not present

## 2023-09-07 DIAGNOSIS — I2699 Other pulmonary embolism without acute cor pulmonale: Secondary | ICD-10-CM | POA: Diagnosis not present

## 2023-09-07 DIAGNOSIS — I10 Essential (primary) hypertension: Secondary | ICD-10-CM

## 2023-09-07 DIAGNOSIS — I5A Non-ischemic myocardial injury (non-traumatic): Secondary | ICD-10-CM

## 2023-09-07 DIAGNOSIS — F0390 Unspecified dementia without behavioral disturbance: Secondary | ICD-10-CM

## 2023-09-07 DIAGNOSIS — R079 Chest pain, unspecified: Secondary | ICD-10-CM | POA: Diagnosis not present

## 2023-09-07 DIAGNOSIS — A419 Sepsis, unspecified organism: Secondary | ICD-10-CM | POA: Diagnosis not present

## 2023-09-07 LAB — CBC
HCT: 42.3 % (ref 36.0–46.0)
Hemoglobin: 12.6 g/dL (ref 12.0–15.0)
MCH: 24 pg — ABNORMAL LOW (ref 26.0–34.0)
MCHC: 29.8 g/dL — ABNORMAL LOW (ref 30.0–36.0)
MCV: 80.7 fL (ref 80.0–100.0)
Platelets: 396 10*3/uL (ref 150–400)
RBC: 5.24 MIL/uL — ABNORMAL HIGH (ref 3.87–5.11)
RDW: 14 % (ref 11.5–15.5)
WBC: 14.2 10*3/uL — ABNORMAL HIGH (ref 4.0–10.5)
nRBC: 0 % (ref 0.0–0.2)

## 2023-09-07 LAB — COMPREHENSIVE METABOLIC PANEL
ALT: 17 U/L (ref 0–44)
AST: 26 U/L (ref 15–41)
Albumin: 2.9 g/dL — ABNORMAL LOW (ref 3.5–5.0)
Alkaline Phosphatase: 49 U/L (ref 38–126)
Anion gap: 12 (ref 5–15)
BUN: 18 mg/dL (ref 8–23)
CO2: 23 mmol/L (ref 22–32)
Calcium: 8.9 mg/dL (ref 8.9–10.3)
Chloride: 107 mmol/L (ref 98–111)
Creatinine, Ser: 0.85 mg/dL (ref 0.44–1.00)
GFR, Estimated: >60 mL/min (ref >60)
Glucose, Bld: 124 mg/dL — ABNORMAL HIGH (ref 70–99)
Potassium: 3.6 mmol/L (ref 3.5–5.1)
Sodium: 142 mmol/L (ref 135–145)
Total Bilirubin: 0.8 mg/dL (ref <1.2)
Total Protein: 6.9 g/dL (ref 6.5–8.1)

## 2023-09-07 LAB — GLUCOSE, CAPILLARY
Glucose-Capillary: 116 mg/dL — ABNORMAL HIGH (ref 70–99)
Glucose-Capillary: 125 mg/dL — ABNORMAL HIGH (ref 70–99)
Glucose-Capillary: 109 mg/dL — ABNORMAL HIGH (ref 70–99)

## 2023-09-07 LAB — TROPONIN I (HIGH SENSITIVITY)
Troponin I (High Sensitivity): 671 ng/L (ref <18)
Troponin I (High Sensitivity): 554 ng/L (ref <18)

## 2023-09-07 LAB — D-DIMER, QUANTITATIVE: D-Dimer, Quant: 2.41 ug{FEU}/mL — ABNORMAL HIGH (ref 0.00–0.50)

## 2023-09-07 LAB — ECHOCARDIOGRAM COMPLETE
Calc EF: 36.4 %
Height: 68 in
S' Lateral: 2.2 cm
Single Plane A2C EF: 40.7 %
Single Plane A4C EF: 25.8 %
Weight: 2673.74 [oz_av]

## 2023-09-07 LAB — HEPARIN LEVEL (UNFRACTIONATED): Heparin Unfractionated: 0.77 [IU]/mL — ABNORMAL HIGH (ref 0.30–0.70)

## 2023-09-07 MED ORDER — INSULIN ASPART 100 UNIT/ML IJ SOLN: 0.0000 [IU] | Freq: Every day | INTRAMUSCULAR | Status: DC

## 2023-09-07 MED ORDER — IOHEXOL 300 MG/ML  SOLN
30.0000 mL | Freq: Once | INTRAMUSCULAR | Status: AC | PRN
  Administered 2023-09-07: 30 mL via ORAL

## 2023-09-07 MED ORDER — PERFLUTREN LIPID MICROSPHERE
1.0000 mL | INTRAVENOUS | Status: DC | PRN
Start: 2023-09-07 — End: 2023-09-07
  Administered 2023-09-07: 3 mL via INTRAVENOUS

## 2023-09-07 MED ORDER — IOHEXOL 350 MG/ML SOLN
75.0000 mL | Freq: Once | INTRAVENOUS | Status: AC | PRN
  Administered 2023-09-07: 75 mL via INTRAVENOUS

## 2023-09-07 MED ORDER — ATORVASTATIN CALCIUM 40 MG PO TABS
80.0000 mg | ORAL_TABLET | Freq: Every day | ORAL | Status: DC
Start: 2023-09-07 — End: 2023-09-10
  Administered 2023-09-07 – 2023-09-10 (×4): 80 mg via ORAL
  Filled 2023-09-07 (×4): qty 2

## 2023-09-07 MED ORDER — MEMANTINE HCL 10 MG PO TABS
10.0000 mg | ORAL_TABLET | Freq: Two times a day (BID) | ORAL | Status: DC
  Administered 2023-09-07 – 2023-09-10 (×7): 10 mg via ORAL
  Filled 2023-09-07 (×7): qty 1

## 2023-09-07 MED ORDER — HEPARIN (PORCINE) 25000 UT/250ML-% IV SOLN
850.0000 [IU]/h | INTRAVENOUS | Status: DC
  Administered 2023-09-07: 1000 [IU]/h via INTRAVENOUS
  Filled 2023-09-07 (×2): qty 250

## 2023-09-07 MED ORDER — ASPIRIN 81 MG PO CHEW
324.0000 mg | CHEWABLE_TABLET | Freq: Once | ORAL | Status: AC
  Administered 2023-09-07: 324 mg via ORAL
  Filled 2023-09-07: qty 4

## 2023-09-07 MED ORDER — IOHEXOL 300 MG/ML  SOLN
100.0000 mL | Freq: Once | INTRAMUSCULAR | Status: AC | PRN
  Administered 2023-09-07: 100 mL via INTRAVENOUS

## 2023-09-07 MED ORDER — HEPARIN BOLUS VIA INFUSION
4000.0000 [IU] | Freq: Once | INTRAVENOUS | Status: AC
  Administered 2023-09-07: 4000 [IU] via INTRAVENOUS
  Filled 2023-09-07: qty 4000

## 2023-09-07 MED ORDER — INSULIN ASPART 100 UNIT/ML IJ SOLN
0.0000 [IU] | Freq: Three times a day (TID) | INTRAMUSCULAR | Status: DC
  Administered 2023-09-07: 1 [IU] via SUBCUTANEOUS

## 2023-09-07 NOTE — Progress Notes (Signed)
PHARMACY - ANTICOAGULATION CONSULT NOTE  Pharmacy Consult for heparin Indication: chest pain/ACS  No Known Allergies  Patient Measurements: Height: 5\' 8"  (172.7 cm) Weight: 75.8 kg (167 lb 1.7 oz) IBW/kg (Calculated) : 63.9 Heparin Dosing Weight: 76kg  Vital Signs: Temp: 97.4 F (36.3 C) (12/11 0325) Temp Source: Oral (12/11 0325) BP: 111/75 (12/11 0325) Pulse Rate: 107 (12/11 0325)  Labs: Recent Labs    09/05/23 1616 09/05/23 1758 09/05/23 1758 09/06/23 0545 09/07/23 0408 09/07/23 0856  HGB  --  14.1   < > 13.9 12.6  --   HCT  --  48.2*  --  46.8* 42.3  --   PLT  --  419*  --  416* 396  --   CREATININE 1.36*  --   --  0.90 0.85  --   TROPONINIHS 4 8  --   --   --  671*   < > = values in this interval not displayed.    Estimated Creatinine Clearance: 53.3 mL/min (by C-G formula based on SCr of 0.85 mg/dL).   Medical History: Past Medical History:  Diagnosis Date   Hypercholesteremia    Hypertension    Seasonal allergies     Assessment: 82 year old female admitted with altered mental status. Now with elevated cardiac enzymes. New orders to start IV heparin. CBC appears normal.   Goal of Therapy:  Heparin level 0.3-0.7 units/ml Monitor platelets by anticoagulation protocol: Yes   Plan:  Give 4000 units bolus x 1 Start heparin infusion at 1000 units/hr Check anti-Xa level in 8 hours and daily while on heparin Continue to monitor H&H and platelets  Sheppard Coil PharmD., BCPS Clinical Pharmacist 09/07/2023 10:30 AM

## 2023-09-07 NOTE — Plan of Care (Signed)

## 2023-09-07 NOTE — Progress Notes (Signed)
Progress Note   Patient: Michelle Mcclain YNW:[id number] DOB: [birth year] DOA: 09/05/2023     1 DOS: the patient was seen and examined on 09/07/2023 at 8:11AM      Brief hospital course: 82 y.o. F with DM, HTN, HLD and dementia, lives in memory care, presented with change in mentation, weakness, found to have UTI.  12/10: Admitted on antibiotics 12/11: New tachcyardia, troponin up, Cardiology consulted      Assessment and Plan: * Sepsis secondary to UTI (HCC) P/w AMS, weakness, leukocytosis, tachcycardia, and pyruria.   Culture no growth to date - Continue Rocephin day 3 - Follow blood and urine cultures    Elevated troponin Trop normal on admission.  Yesterday on my exam, patient asymptomatic.  Overnight, patient had new sinus tachycardia to 120s. Per report, there was an episode of chest pain for 1 hour yesteday, which I don't see documented and which wasn't relayed to me.  Due to tachycardia, ECG obtained this morning showed new TW changes.  Trop now 671.   COmpletely asymptomatic again this mronign  Discussed with Cardiology.  Given nonspecific findings on CT chest, will obtain CTA and start empiric heparin - STart heparin - Aspirin 324 now - Obtain echo - Start heparin - Obtain CTA chest rule out PE    Abnormal CT of the chest Asymptomatic on amdission.  ER and admitting MD had no suspicion for PE, nor did I clinically.  My assumption was that the infiltrates on noncontrasted CT were from pneumonia.  In light of troponin and tachycardia, we need to rule out PE - Start heparin gtt - OBtain CT PE   Pressure injury of skin Buttocks, medial stage II, present on admission - Daily wound care  Dementia without behavioral disturbance (HCC) - Resume home memantine  Acute metabolic encephalopathy Lives at home with daughter and husband.  At baseline oriented to self and place but not time.  Prior to admission and still (per daughter) patient is more disoriented to  situation and more forgetful and confused than baseline.  No focal deficits to suggest stroke.  Appaers as delirium from underlying medical issues. - Hold Lyrica - Resume memantine - Standard delirium precautions: blinds open and lights on during day, TV off, minimize interruptions at night, glasses/hearing aids, PT/OT, avoiding Beers list medications    Controlled type 2 diabetes mellitus without complication, without long-term current use of insulin (HCC) Glucose controlled - Start SS corrections - Hold home metformin    Mixed hyperlipidemia - Resume Lipitor   Essential hypertension BP controlled - Hold hydrochlorothiazide and losartan and metoprolol for now  AKI (acute kidney injury) (HCC) Resolved  Hypercalcemia Resolved  Hypernatremia Resolved  Abnormal CT of the abdomen Thickening of the stomach indeterminate on CT without contrast - CT abdomen pending          Subjective: Patient asymptomatic, feels "fine". Daughter reports chest pain episode yesterday for 1 hour.  No fever, no respiraotyr distress.  Patient denies abdominal pain.  Nursing report no events overnight. Daughter reports ongoing confusion relative to basline.     Physical Exam: BP 111/75 (BP Location: Right Arm)   Pulse (!) 107   Temp (!) 97.4 F (36.3 C) (Oral)   Resp 16   Ht 5\' 8"  (1.727 m)   Wt 75.8 kg   SpO2 99%   BMI 25.41 kg/m   Adult female, lying in bed, sleeping, makes eye contact and responds Tachycardic, regular, no murmurs, no JVD, no LE edema Respiraotry rate  seems normal, lungs diminished but no rales or wheeezing. Abdomen soft and benign  Attention normal, affect slowed and tired.  Face symmetric, upper extremity strength weak but symmetric, speech fluent but slow     Data Reviewed: Discussed with Cardiology BMP shows resolved AKI CBC shows worsening leukocytosis  Family Communication: Daughter    Disposition: Status is: Inpatient Ptient was admitted with  sepsis from presumed UTI  Overnight has developed tachcyardia, new troponin leak, ECG changes  Cardiology consulted,heparin drip started        Author: Alberteen Sam, MD 09/07/2023 9:54 AM  For on call review [email or web address].

## 2023-09-07 NOTE — Plan of Care (Signed)
   Problem: Coping: Goal: Level of anxiety will decrease Outcome: Progressing   Problem: Pain Management: Goal: General experience of comfort will improve Outcome: Progressing   Problem: Skin Integrity: Goal: Risk for impaired skin integrity will decrease Outcome: Progressing

## 2023-09-07 NOTE — Progress Notes (Signed)
PT Cancellation Note  Patient Details Name: YZABEL MAJURE MRN: [id number] DOB: 01-Jul-1941   Cancelled Treatment:      Pt with new diagnosis of PE this am. Pt started on Heparin @ 11 am this date. Per PT protocol, hold mobility for 24 hours until initial dose of Heparin given and within therapeutic range.   Acute PT will continue to follow pt status and evaluate when medically ready and within protocol.    Nelida Meuse 09/07/2023, 1:56 PM

## 2023-09-07 NOTE — Assessment & Plan Note (Addendum)
On admission, there was no clinical suspicion for PE and she had no chest pain and so the CT findings on non-contrast CT abdomen were not suspected to be PE.  Later, noted to have tachycardia and then found to have transient CP and so CTA obtained that confirmed RLL segmental PE.  Remains hemodynamically stable other than tachycardia. - Continue heparin drip - Follow echo

## 2023-09-07 NOTE — Progress Notes (Signed)
OT Cancellation Note  Patient Details Name: Michelle Mcclain MRN: [id number] DOB: 05/04/1941   Cancelled Treatment:    Reason Eval/Treat Not Completed: Medical issues which prohibited therapy. Per nursing pt with STEMI, just began Heparin, hold until medically appropriate.    Ezra Sites, OTR/L  [phone] 09/07/2023, 11:12 AM

## 2023-09-07 NOTE — Progress Notes (Signed)
PHARMACY - ANTICOAGULATION CONSULT NOTE  Pharmacy Consult for heparin Indication: chest pain/ACS  No Known Allergies  Patient Measurements: Height: 5\' 8"  (172.7 cm) Weight: 75.8 kg (167 lb 1.7 oz) IBW/kg (Calculated) : 63.9 Heparin Dosing Weight: 76kg  Vital Signs: Temp: 97.9 F (36.6 C) (12/11 1958) Temp Source: Oral (12/11 1652) BP: 103/62 (12/11 1958) Pulse Rate: 99 (12/11 1958)  Labs: Recent Labs    09/05/23 1616 09/05/23 1616 09/05/23 1758 09/06/23 0545 09/07/23 0408 09/07/23 0856 09/07/23 1041 09/07/23 2015  HGB  --    < > 14.1 13.9 12.6  --   --   --   HCT  --   --  48.2* 46.8* 42.3  --   --   --   PLT  --   --  419* 416* 396  --   --   --   HEPARINUNFRC  --   --   --   --   --   --   --  0.77*  CREATININE 1.36*  --   --  0.90 0.85  --   --   --   TROPONINIHS 4  --  8  --   --  671* 554*  --    < > = values in this interval not displayed.    Estimated Creatinine Clearance: 53.3 mL/min (by C-G formula based on SCr of 0.85 mg/dL).   Medical History: Past Medical History:  Diagnosis Date   Hypercholesteremia    Hypertension    Seasonal allergies     Assessment: 82 year old female admitted with altered mental status. Now with elevated cardiac enzymes. New orders to start IV heparin. CBC appears normal.   Goal of Therapy:  Heparin level 0.3-0.7 units/ml Monitor platelets by anticoagulation protocol: Yes   12/11@2015 : HL 0.77, Supratherapeutic@1000  units/hr  Plan:  Decrease heparin infusion to 900 units/hr Recheck HL with AM labs tomorrow Continue to monitor H&H and platelets  Bettey Costa, PharmD Clinical Pharmacist 09/07/2023 9:08 PM

## 2023-09-07 NOTE — Progress Notes (Signed)
Echocardiogram 2D Echocardiogram has been performed.   Warren Lacy Zelia Yzaguirre RDCS 09/07/2023, 1:44 PM  Dr Jenene Slicker notified

## 2023-09-07 NOTE — Progress Notes (Signed)
CTA resulted earlier today showing new PE.  Already on heparin.  Echo already pending.  No change to plan at present.

## 2023-09-07 NOTE — Progress Notes (Signed)
Echocardiogram 2D Echocardiogram has been performed.  Warren Lacy Talayah Picardi RDCS 09/07/2023, 1:43 PM

## 2023-09-07 NOTE — Progress Notes (Signed)
PT Cancellation Note  Patient Details Name: Michelle Mcclain MRN: [id number] DOB: 08/15/1941   Cancelled Treatment:      Per discussion with OT, who discussed with nursing, pt with STEMI, just began Heparin, hold until medically appropriate.   PT will continue to follow pt and evaluate when medically appropriate.    Nelida Meuse 09/07/2023, 11:16 AM

## 2023-09-07 NOTE — Consult Note (Signed)
Cardiology Consultation   Patient ID: [name] MRN: [id number]; DOB: 09-Aug-1941  Admit date: 09/05/2023 Date of Consult: 09/07/2023  PCP:  Butch Penny, MD (Inactive)   [facility] Providers Cardiologist: New to HeartCare   Patient Profile:   Michelle Mcclain is a 82 y.o. female with a hx of HTN, HLD and dementia who is being seen 09/07/2023 for the evaluation of chest pain and abnormal EKG at the request of Dr. Maryfrances Bunnell.  History of Present Illness:   Michelle Mcclain presented to [facility] ED on 09/05/2023 after family members reported she had worsening confusion over the past several days leading to admission and refused to take medications. Initial labs showed WBC 13.2, Hgb 14.1, platelets 419, Na+ 146, K+ 4.5 and creatinine 1.36. AST 31 and ALT 24. Initial and repeat Hs troponin values were initially negative at 4 and 8. Lactic acid was elevated at 3.6 with repeat at 2.9, 2.3 and 1.4.  UA was concerning for a UTI. CXR showed no active disease. CT Head showed no acute intracranial abnormalities. Was noted to have mild age-related atrophy and chronic microvascular ischemia. CT Chest/Abdomen showed a focal groundglass opacity at the right lung base which is overall nonspecific but early pulmonary infarct could not be excluded. Was also noted to have a soft tissue prominence at the gastric cardia and concerning for mass with CT Abdomen recommended for further evaluation. Initial EKG showed normal sinus rhythm, heart rate 95 with upsloping ST segment along the precordial leads but no acute changes.  She was admitted for further management of sepsis in the setting of a UTI and started on IV antibiotics. Yesterday, she reported having chest pain and repeat EKG was obtained this morning which shows normal sinus rhythm, heart rate 116 with diffuse ST elevation along the inferior and precordial leads. Repeat labs this morning show that her D-dimer is elevated at 2.41.  Initial Hs Troponin also at 671 with repeat 554.  She underwent CT angio chest today that showed pulm embolism involving segmental and subsegmental pulmonary arteries within the right lower lobe.  No central pulmonary embolism and no evidence of right heart strain.  There was also increased perinephric stranding anterior to the right kidney secondary to urinary tract infection.  Similar degree of abnormal thickening of the gastric cardia, underlying gastric mass cannot be excluded and recommended to correlate with endoscopy.  Patient seen and examined at the bedside.  She is alert and oriented times self.  She does not recall the chest pain history.  Per daughter, she did complain of chest pain yesterday for 1 to 2 hours that was resolved with Tylenol.  EKG today showed diffuse ST elevations, biphasic T waves in the anterior leads but not true STEMI.   Past Medical History:  Diagnosis Date   Hypercholesteremia    Hypertension    Seasonal allergies     Past Surgical History:  Procedure Laterality Date   ABDOMINAL HYSTERECTOMY     COLONOSCOPY     COLONOSCOPY N/A 01/09/2015   Procedure: COLONOSCOPY;  Surgeon: Malissa Hippo, MD;  Location: [facility];  Service: Endoscopy;  Laterality: N/A;  830     Home Medications:  Prior to Admission medications   Medication Sig Start Date End Date Taking? Authorizing Provider  atorvastatin (LIPITOR) 80 MG tablet Take 80 mg by mouth daily. 09/04/23  Yes [provider]  hydrochlorothiazide (HYDRODIURIL) 50 MG tablet Take 50 mg by mouth daily. 08/04/23  Yes [provider]  losartan (COZAAR) 100 MG tablet Take 100 mg by mouth daily. 09/04/23  Yes [provider]  memantine (NAMENDA) 10 MG tablet Take 10 mg by mouth 2 (two) times daily. 07/01/23  Yes [provider]  metFORMIN (GLUCOPHAGE) 1000 MG tablet Take 1,000 mg by mouth 2 (two) times daily. 08/04/23  Yes [provider]  metoprolol succinate (TOPROL-XL) 25 MG  24 hr tablet Take 25 mg by mouth daily. 06/30/23  Yes [provider]  Multiple Vitamins-Minerals (MULTIVITAMINS THER. W/MINERALS) TABS tablet Take 1 tablet by mouth daily.   Yes [provider]  pregabalin (LYRICA) 75 MG capsule Take 75 mg by mouth 2 (two) times daily. 07/04/23  Yes [provider]    Inpatient Medications: Scheduled Meds:  enoxaparin (LOVENOX) injection  40 mg Subcutaneous Q24H   Continuous Infusions:  cefTRIAXone (ROCEPHIN)  IV Stopped (09/06/23 0850)   lactated ringers     PRN Meds: acetaminophen, melatonin, polyethylene glycol, prochlorperazine  Allergies:   No Known Allergies  Social History:   Social History   Socioeconomic History   Marital status: Married    Spouse name: Not on file   Number of children: Not on file   Years of education: Not on file   Highest education level: Not on file  Occupational History   Not on file  Tobacco Use   Smoking status: Never   Smokeless tobacco: Not on file  Substance and Sexual Activity   Alcohol use: No   Drug use: No   Sexual activity: Not on file  Other Topics Concern   Not on file  Social History Narrative   Not on file   Social Determinants of Health   Financial Resource Strain: Not on file  Food Insecurity: No Food Insecurity (09/06/2023)   Hunger Vital Sign    Worried About Running Out of Food in the Last Year: Never true    Ran Out of Food in the Last Year: Never true  Transportation Needs: No Transportation Needs (09/06/2023)   PRAPARE - Administrator, Civil Service (Medical): No    Lack of Transportation (Non-Medical): No  Physical Activity: Not on file  Stress: Not on file  Social Connections: Not on file  Intimate Partner Violence: Not At Risk (09/06/2023)   Humiliation, Afraid, Rape, and Kick questionnaire    Fear of Current or Ex-Partner: No    Emotionally Abused: No    Physically Abused: No    Sexually Abused: No    Family History:      History reviewed. No pertinent family history.   ROS:  Please see the history of present illness.   All other ROS reviewed and negative.     Physical Exam/Data:   Vitals:   09/06/23 2018 09/06/23 2059 09/06/23 2308 09/07/23 0325  BP: 115/78 91/69 112/74 111/75  Pulse: (!) 124 (!) 122 (!) 110 (!) 107  Resp: 20 18 18 16   Temp:  98.4 F (36.9 C) 98.5 F (36.9 C) (!) 97.4 F (36.3 C)  TempSrc:  Oral  Oral  SpO2: 97% 99% 100% 99%  Weight:      Height:        Intake/Output Summary (Last 24 hours) at 09/07/2023 0940 Last data filed at 09/07/2023 0300 Gross per 24 hour  Intake 935.63 ml  Output 300 ml  Net 635.63 ml      09/06/2023    8:06 AM 09/05/2023    3:41 PM 01/09/2015    7:41 AM  Last 3 Weights  Weight (lbs) 167 lb 1.7 oz 213 lb 13.5 oz 215 lb  Weight (kg) 75.8 kg 97 kg 97.523 kg     Body mass index is 25.41 kg/m.  General:  Well nourished, well developed, in no acute distress HEENT: normal Neck: no JVD Vascular: No carotid bruits; Distal pulses 2+ bilaterally Cardiac:  normal S1, S2; RRR; no murmur  Lungs:  clear to auscultation bilaterally, no wheezing, rhonchi or rales  Abd: soft, nontender, no hepatomegaly  Ext: no edema Musculoskeletal:  No deformities, BUE and BLE strength normal and equal Skin: warm and dry  Neuro: Alert and oriented to self  EKG:  The EKG was personally reviewed and demonstrates: normal sinus rhythm, heart rate 116 with diffuse ST elevation along the inferior and precordial leads. Telemetry:  Not currently on telemetry.   Relevant CV Studies:  Echocardiogram: Pending  Laboratory Data:  High Sensitivity Troponin:   Recent Labs  Lab 09/05/23 1616 09/05/23 1758 09/07/23 0856  TROPONINIHS 4 8 671*     Chemistry Recent Labs  Lab 09/05/23 1616 09/06/23 0545 09/07/23 0408  NA 146* 145 142  K 4.5 4.1 3.6  CL 101 107 107  CO2 20* 23 23  GLUCOSE 145* 106* 124*  BUN 48* 33* 18  CREATININE 1.36* 0.90 0.85  CALCIUM 10.7* 9.5  8.9  MG 2.2 1.7  --   GFRNONAA 39* >60 >60  ANIONGAP 25* 15 12    Recent Labs  Lab 09/05/23 1616 09/06/23 0545 09/07/23 0408  PROT 9.3* 7.7 6.9  ALBUMIN 4.1 3.4* 2.9*  AST 31 29 26   ALT 24 20 17   ALKPHOS 64 53 49  BILITOT 1.2* 0.9 0.8   Lipids No results for input(s): "CHOL", "TRIG", "HDL", "LABVLDL", "LDLCALC", "CHOLHDL" in the last 168 hours.  Hematology Recent Labs  Lab 09/05/23 1758 09/06/23 0545 09/07/23 0408  WBC 13.2* 11.3* 14.2*  RBC 5.78* 5.76* 5.24*  HGB 14.1 13.9 12.6  HCT 48.2* 46.8* 42.3  MCV 83.4 81.3 80.7  MCH 24.4* 24.1* 24.0*  MCHC 29.3* 29.7* 29.8*  RDW 14.2 13.9 14.0  PLT 419* 416* 396   Thyroid No results for input(s): "TSH", "FREET4" in the last 168 hours.  BNPNo results for input(s): "BNP", "PROBNP" in the last 168 hours.  DDimer  Recent Labs  Lab 09/07/23 0408  DDIMER 2.41*     Radiology/Studies:  US Venous Img Lower Bilateral (DVT)  Result Date: 09/06/2023 CLINICAL DATA:  BILATERAL lower extremity edema 9965.  Pain. EXAM: BILATERAL LOWER EXTREMITY VENOUS DOPPLER ULTRASOUND TECHNIQUE: Gray-scale sonography with graded compression, as well as color Doppler and duplex ultrasound were performed to evaluate the lower extremity deep venous systems from the level of the common femoral vein and including the common femoral, femoral, profunda femoral, popliteal and calf veins including the posterior tibial, peroneal and gastrocnemius veins when visible. The superficial great saphenous vein was also interrogated. Spectral Doppler was utilized to evaluate flow at rest and with distal augmentation maneuvers in the common femoral, femoral and popliteal veins. COMPARISON:  CT CAP, 09/05/2023. FINDINGS: RIGHT LOWER EXTREMITY VENOUS Normal compressibility of the RIGHT common femoral vein. Incomplete evaluation at the superficial femoral, and popliteal veins, as well as the calf veins secondary to patient discomfort with compression. Limitations: Patient could  not tolerate compressions LEFT LOWER EXTREMITY VENOUS Normal compressibility of the LEFT common femoral, superficial femoral, and popliteal veins, as well as the visualized calf veins. Visualized portions of profunda femoral vein and great saphenous vein  unremarkable. No filling defects to suggest DVT on grayscale or color Doppler imaging. Doppler waveforms show normal direction of venous flow, normal respiratory plasticity and response to augmentation. OTHER No evidence of superficial thrombophlebitis or abnormal fluid collection. Limitations: none IMPRESSION: Suboptimal evaluation, within these constraints; 1. No evidence of femoropopliteal DVT or superficial thrombophlebitis within the LEFT lower extremity. 2. Incomplete assessment of the RIGHT lower extremity secondary to patient discomfort. Roanna Banning, MD [facility] Rad[facility] Electronically Signed   By: Roanna Banning M.D.   On: 09/06/2023 09:57   CT CHEST ABDOMEN PELVIS WO CONTRAST  Result Date: 09/05/2023 CLINICAL DATA:  Sepsis, dementia EXAM: CT CHEST, ABDOMEN AND PELVIS WITHOUT CONTRAST TECHNIQUE: Multidetector CT imaging of the chest, abdomen and pelvis was performed following the standard protocol without IV contrast. RADIATION DOSE REDUCTION: This exam was performed according to the departmental dose-optimization program which includes automated exposure control, adjustment of the mA and/or kV according to patient size and/or use of iterative reconstruction technique. COMPARISON:  None Available. FINDINGS: CT CHEST FINDINGS Cardiovascular: The heart is normal in size. No pericardial effusion. No evidence of thoracic aortic aneurysm. Atherosclerotic calcifications of the aortic arch. Severe three-coronary atherosclerosis. Mediastinum/Nodes: No suspicious mediastinal lymphadenopathy. Subcentimeter calcified left thyroid nodules, benign. No follow-up is recommended. Lungs/Pleura: Mild subpleural  ground-glass opacity with central low-density at the lateral right lung base (series 4/image 108). This appearance is nonspecific but at least raises the possibility of an early pulmonary infarct. Mild bibasilar atelectasis/scarring. No suspicious pulmonary nodules. No pleural effusion or pneumothorax. Musculoskeletal: Degenerative changes of the mid thoracic spine. CT ABDOMEN PELVIS FINDINGS Hepatobiliary: Unenhanced liver is unremarkable. Gallbladder is unremarkable. No intrahepatic or extrahepatic duct dilatation. Pancreas: Within normal limits. Spleen: Within normal limits but Adrenals/Urinary Tract: Adrenal glands are within normal limits. Left kidney is within normal limits. Right renal cysts measuring up to 2.0 cm (series 3/image 69), benign (Bosniak I). No follow-up is recommended. No renal, ureteral, or bladder calculi.  No hydronephrosis. Bladder is within normal limits. Stomach/Bowel: Stomach is notable for a tiny hiatal hernia. Soft tissue prominence at the gastric cardia (series 3/image 51), poorly evaluated on unenhanced CT, but at least raising the possibility of a mass. No evidence of bowel obstruction. Appendix is not discretely visualized. Extensive sigmoid diverticulosis, without evidence of diverticulitis. Vascular/Lymphatic: No evidence of abdominal aortic aneurysm. Atherosclerotic calcifications of the abdominal aorta and branch vessels. No suspicious abdominopelvic lymphadenopathy. Reproductive: Status post hysterectomy. No adnexal masses. Other: No abdominopelvic ascites. Musculoskeletal: Mild degenerative changes at L5-S1. IMPRESSION: Focal ground-glass opacity at the right lung base, nonspecific. However, early pulmonary infarct could have this appearance. Consider PE protocol CT for further evaluation, as clinically warranted. Soft tissue prominence at the gastric cardia, poorly evaluated on unenhanced CT, but at least raising the possibility of a mass. Consider CT abdomen with contrast  for further evaluation. Extensive sigmoid diverticulosis, without evidence of diverticulitis. Aortic Atherosclerosis (ICD10-I70.0). Electronically Signed   By: Charline Bills M.D.   On: 09/05/2023 20:56   CT Head Wo Contrast  Result Date: 09/05/2023 CLINICAL DATA:  Altered mental status. EXAM: CT HEAD WITHOUT CONTRAST TECHNIQUE: Contiguous axial images were obtained from the base of the skull through the vertex without intravenous contrast. RADIATION DOSE REDUCTION: This exam was performed according to the departmental dose-optimization program which includes automated exposure control, adjustment of the mA and/or kV according to patient size and/or use of iterative reconstruction technique. COMPARISON:  None Available. FINDINGS: Brain: Mild age-related atrophy and  chronic microvascular ischemic changes. There is no acute intracranial hemorrhage. No mass effect or midline shift. No extra-axial fluid collection. Vascular: No hyperdense vessel or unexpected calcification. Skull: Normal. Negative for fracture or focal lesion. Sinuses/Orbits: No acute finding. Other: None IMPRESSION: 1. No acute intracranial pathology. 2. Mild age-related atrophy and chronic microvascular ischemic changes. Electronically Signed   By: Elgie Collard M.D.   On: 09/05/2023 20:45   DG Chest Port 1 View  Result Date: 09/05/2023 CLINICAL DATA:  Questionable sepsis - evaluate for abnormality EXAM: PORTABLE CHEST 1 VIEW COMPARISON:  None Available. FINDINGS: Normal heart size. Normal mediastinal contour. No pneumothorax. No pleural effusion. Lungs appear clear, with no acute consolidative airspace disease and no pulmonary edema. IMPRESSION: No active disease. Electronically Signed   By: Delbert Phenix M.D.   On: 09/05/2023 17:44     Assessment and Plan:   Abnormal EKG Chest pain likely secondary to acute PE Myocardial injury likely secondary demand ischemia from acute PE Sepsis secondary to UTI Possible gastric  mass Dementia (AAO times self)   -Currently admitted to hospitalist team for the management of sepsis secondary to UTI on antibiotics however last night, she was noted to be tachycardic and had 1 episode of chest pain lasting for about 1 to 2 hours yesterday morning.  EKG was obtained this morning that showed diffuse ST elevations with no reciprocal ST depressions suggestive of not a true STEMI.  QTc interval was prolonged in the setting of tachycardia.  D-dimer was elevated today at 2.41. Hs troponins are elevated, 671>>554 today (normal on admission).  Received aspirin 324 mg and started heparin drip for empiric management of NSTEMI however CT angio chest obtained this morning showed pulm embolism involving segmental and subsegmental branches within the right lower lobe of the lung.  Troponin elevation likely secondary to demand ischemia from acute PE.  Continue heparin drip for PE management.  Does not have ACS.  Obtain echocardiogram.   For questions or updates, please contact [facility] Please consult [email or web address] for contact info under    Signed, Chimaobi Casebolt Verne Spurr, MD [facility]

## 2023-09-08 ENCOUNTER — Other Ambulatory Visit (HOSPITAL_COMMUNITY): Payer: Self-pay

## 2023-09-08 ENCOUNTER — Telehealth (HOSPITAL_COMMUNITY): Payer: Self-pay | Admitting: Pharmacy Technician

## 2023-09-08 DIAGNOSIS — I5181 Takotsubo syndrome: Secondary | ICD-10-CM

## 2023-09-08 DIAGNOSIS — R9389 Abnormal findings on diagnostic imaging of other specified body structures: Secondary | ICD-10-CM | POA: Diagnosis not present

## 2023-09-08 DIAGNOSIS — E876 Hypokalemia: Secondary | ICD-10-CM | POA: Insufficient documentation

## 2023-09-08 DIAGNOSIS — N3000 Acute cystitis without hematuria: Secondary | ICD-10-CM

## 2023-09-08 DIAGNOSIS — I429 Cardiomyopathy, unspecified: Secondary | ICD-10-CM

## 2023-09-08 DIAGNOSIS — I2699 Other pulmonary embolism without acute cor pulmonale: Secondary | ICD-10-CM | POA: Diagnosis not present

## 2023-09-08 DIAGNOSIS — R935 Abnormal findings on diagnostic imaging of other abdominal regions, including retroperitoneum: Secondary | ICD-10-CM | POA: Diagnosis not present

## 2023-09-08 DIAGNOSIS — A419 Sepsis, unspecified organism: Secondary | ICD-10-CM | POA: Diagnosis not present

## 2023-09-08 DIAGNOSIS — R7989 Other specified abnormal findings of blood chemistry: Secondary | ICD-10-CM | POA: Diagnosis not present

## 2023-09-08 DIAGNOSIS — I214 Non-ST elevation (NSTEMI) myocardial infarction: Secondary | ICD-10-CM | POA: Insufficient documentation

## 2023-09-08 LAB — COMPREHENSIVE METABOLIC PANEL
ALT: 16 U/L (ref 0–44)
AST: 24 U/L (ref 15–41)
Albumin: 2.6 g/dL — ABNORMAL LOW (ref 3.5–5.0)
Alkaline Phosphatase: 44 U/L (ref 38–126)
Anion gap: 13 (ref 5–15)
BUN: 15 mg/dL (ref 8–23)
CO2: 23 mmol/L (ref 22–32)
Calcium: 8.8 mg/dL — ABNORMAL LOW (ref 8.9–10.3)
Chloride: 105 mmol/L (ref 98–111)
Creatinine, Ser: 0.88 mg/dL (ref 0.44–1.00)
GFR, Estimated: >60 mL/min (ref >60)
Glucose, Bld: 105 mg/dL — ABNORMAL HIGH (ref 70–99)
Potassium: 3.1 mmol/L — ABNORMAL LOW (ref 3.5–5.1)
Sodium: 141 mmol/L (ref 135–145)
Total Bilirubin: 0.7 mg/dL (ref <1.2)
Total Protein: 6.8 g/dL (ref 6.5–8.1)

## 2023-09-08 LAB — HEPARIN LEVEL (UNFRACTIONATED)
Heparin Unfractionated: <0.1 [IU]/mL — ABNORMAL LOW (ref 0.30–0.70)
Heparin Unfractionated: 0.67 [IU]/mL (ref 0.30–0.70)

## 2023-09-08 LAB — CBC
HCT: 37.3 % (ref 36.0–46.0)
Hemoglobin: 11.2 g/dL — ABNORMAL LOW (ref 12.0–15.0)
MCH: 24 pg — ABNORMAL LOW (ref 26.0–34.0)
MCHC: 30 g/dL (ref 30.0–36.0)
MCV: 80 fL (ref 80.0–100.0)
Platelets: 361 10*3/uL (ref 150–400)
RBC: 4.66 MIL/uL (ref 3.87–5.11)
RDW: 14.1 % (ref 11.5–15.5)
WBC: 11.9 10*3/uL — ABNORMAL HIGH (ref 4.0–10.5)
nRBC: 0 % (ref 0.0–0.2)

## 2023-09-08 LAB — GLUCOSE, CAPILLARY
Glucose-Capillary: 117 mg/dL — ABNORMAL HIGH (ref 70–99)
Glucose-Capillary: 96 mg/dL (ref 70–99)
Glucose-Capillary: 103 mg/dL — ABNORMAL HIGH (ref 70–99)
Glucose-Capillary: 99 mg/dL (ref 70–99)

## 2023-09-08 LAB — URINE CULTURE

## 2023-09-08 MED ORDER — METOPROLOL SUCCINATE ER 25 MG PO TB24
12.5000 mg | ORAL_TABLET | Freq: Every day | ORAL | Status: DC
  Administered 2023-09-08 – 2023-09-10 (×3): 12.5 mg via ORAL
  Filled 2023-09-08 (×3): qty 1

## 2023-09-08 MED ORDER — POTASSIUM CHLORIDE CRYS ER 20 MEQ PO TBCR
40.0000 meq | EXTENDED_RELEASE_TABLET | Freq: Two times a day (BID) | ORAL | Status: AC
  Administered 2023-09-08 (×2): 40 meq via ORAL
  Filled 2023-09-08 (×2): qty 2

## 2023-09-08 MED ORDER — HEPARIN BOLUS VIA INFUSION
3000.0000 [IU] | Freq: Once | INTRAVENOUS | Status: AC
  Administered 2023-09-08: 3000 [IU] via INTRAVENOUS
  Filled 2023-09-08: qty 3000

## 2023-09-08 NOTE — TOC Progression Note (Signed)
Transition of Care [facility]) - Progression Note    Patient Details  Name: Michelle Mcclain MRN: [id number] Date of Birth: September 25, 1941  Transition of Care [facility]) CM/SW Contact  Karn Cassis, [place] Phone Number: 09/08/2023, 3:36 PM  Clinical Narrative: PT recommending SNF. LCSW discussed with pt's daughter who is agreeable. Referral sent to [facility]. Discussed SNF authorization and Medicare.gov for ratings. CMA starting auth. TOC will follow up with bed offers when available.      Expected Discharge Plan: Home w Home Health Services Barriers to Discharge: Continued Medical Work up  Expected Discharge Plan and Services In-house Referral: Clinical Social Work     Living arrangements for the past 2 months: Single Family Home                                       Social Determinants of Health (SDOH) Interventions SDOH Screenings   Food Insecurity: No Food Insecurity (09/06/2023)  Housing: Low Risk  (09/06/2023)  Transportation Needs: No Transportation Needs (09/06/2023)  Utilities: Not At Risk (09/06/2023)  Tobacco Use: Unknown (09/05/2023)    Readmission Risk Interventions     No data to display

## 2023-09-08 NOTE — Progress Notes (Signed)
PHARMACY - ANTICOAGULATION Pharmacy Consult for heparin Indication: pulmonary embolism  No Known Allergies  Patient Measurements: Height: 5\' 8"  (172.7 cm) Weight: 75.8 kg (167 lb 1.7 oz) IBW/kg (Calculated) : 63.9 Heparin Dosing Weight: 76kg  Vital Signs: Temp: 97.6 F (36.4 C) (12/12 0507) BP: 113/72 (12/12 0507) Pulse Rate: 106 (12/12 0507)  Labs: Recent Labs    09/05/23 1758 09/06/23 0545 09/07/23 0408 09/07/23 0856 09/07/23 1041 09/07/23 2015 09/08/23 0442  HGB 14.1 13.9 12.6  --   --   --  11.2*  HCT 48.2* 46.8* 42.3  --   --   --  37.3  PLT 419* 416* 396  --   --   --  361  HEPARINUNFRC  --   --   --   --   --  0.77* <0.10*  CREATININE  --  0.90 0.85  --   --   --  0.88  TROPONINIHS 8  --   --  671* 554*  --   --     Estimated Creatinine Clearance: 51.4 mL/min (by C-G formula based on SCr of 0.88 mg/dL).  Assessment: 82 y.o. female with RLL PE for heparin. Heparin off briefly overnight d/t loss of IV site. Restarted this am and repeat level now at goal but at upper end. Will decrease rate slightly and recheck in am.   Goal of Therapy:  Heparin level 0.3-0.7 units/ml Monitor platelets by anticoagulation protocol: Yes   Plan:  Decrease heparin to 850 units/hr Recheck in am Plan for transition to doac 12/13  Sheppard Coil PharmD., BCPS Clinical Pharmacist 09/08/2023 7:46 AM

## 2023-09-08 NOTE — Progress Notes (Signed)
Progress Note   Patient: Michelle Mcclain WNU:[id number] DOB: 1941-05-15 DOA: 09/05/2023     2 DOS: the patient was seen and examined on 09/08/2023 at 7:59AM      Brief hospital course: 82 y.o. F with DM, HTN, HLD and dementia, lives in memory care, presented with change in mentation, weakness, found to have UTI.  12/10: Admitted on antibiotics 12/11: New tachcyardia, troponin up, Cardiology consulted; CTA showed PE; Echo with reduced EF, RWMA 12/12: Stable     Assessment and Plan: * Sepsis secondary to UTI (HCC) P/w AMS, weakness, leukocytosis, tachcycardia, and pyruria.   CT showed pyelonephritis of right kidney, UA consistent with UTI. Urine culture insignificant growth.  Blood cultures no growth. - Continue Rocephin day 4 of 7      Acute pulmonary embolism (HCC) Remains asymptomatic and hemodynamically stable. -Continue heparin infusion - Transition to DOAC tomorrow     Cardiomyopathy [facility]) Demand ischemia There are some uncertainty if her cardiomyopathy and regional wall motion abnormalities are for LAD plaque, or reactive to the PE, but favor the latter  Cardiology consulted, recommend against invasive strategy, we will plan for medical management alone for now. No clear heart failure symptoms at present - Continue Lipitor - Defer BB, ARB, ARNI, etc. to Cardiology - Defer Plavix, aspirin to Cardiology   Hypokalemia - Supplement K   Pressure injury of skin Buttocks, medial stage II, present on admission - Daily wound care  Dementia without behavioral disturbance (HCC) - Continue home memantine  Acute metabolic encephalopathy Delirium She remains intermittently confused - Hold Lyrica - Continue memantine - Standard delirium precautions: blinds open and lights on during day, TV off, minimize interruptions at night, glasses/hearing aids, PT/OT, avoiding Beers list medications    Controlled type 2 diabetes mellitus without complication, without  long-term current use of insulin (HCC) Glucose controlled - Continue sliding scale corrections - Hold home metformin    Mixed hyperlipidemia -Continue Lipitor  Essential hypertension BP normal off meds - Hold hydrochlorothiazide and losartan and metoprolol for now  AKI (acute kidney injury) (HCC) Resolved  Hypercalcemia Resolved  Hypernatremia Resolved  Abnormal CT of the abdomen In the setting of her functional decline over the last 6 months, poor p.o. intake, and new PE, this is obviously concerning for malignancy.  Given her delirium I do not think an invasive strategy at this time is appropriate.  Family will discuss among some cells and share with me whether they would prefer workup in the future.           Subjective: Patient reports no concerns, she has no chest pain, dyspnea, abdominal pain, urinary discomfort.  Daughter notes that she is still intermittently fairly confused and disoriented, more than her baseline.  Nursing have no concerns.  No bleeding.     Physical Exam: BP 113/72 (BP Location: Left Arm)   Pulse (!) 106   Temp 97.6 F (36.4 C)   Resp 16   Ht 5\' 8"  (1.727 m)   Wt 75.8 kg   SpO2 96%   BMI 25.41 kg/m   Elderly adult female, lying in bed, sleeping but arouses and makes eye contact Tachycardic, regular, no murmurs, no peripheral edema, no JVD, lying flat Respiratory rate seems slightly elevated, but no dyspnea or accessory muscle use is observed Lung sounds clear, no rales or wheezes appreciated Abdomen soft without grimace to palpation, no guarding Attention normal, mild psychomotor slowing is noted, oriented to self and daughter only, face symmetric, moves upper extremities with  generalized weakness but symmetric strength    Data Reviewed: Case discussed with cardiology Patient metabolic panel shows normal sodium and creatinine, potassium down to 3.1 CBC shows white count down to 11.9, hemoglobin stable Glucose normal Blood  cultures no growth to date   Family Communication: Bedside    Disposition: Status is: Inpatient The patient was admitted with UTI sepsis  She was subsequently found to have pulmonary embolism and new cardiomyopathy  Cardiology are involved and will guide management for cardiomyopathy We will plan to transition from IV heparin to DOAC tomorrow, then likely discharge on oral antibiotics, DOAC, with cardiology follow-up, and possibly follow-up for her GI abnormality          Author: Alberteen Sam, MD 09/08/2023 10:00 AM  For on call review [email or web address].

## 2023-09-08 NOTE — Progress Notes (Signed)
Progress Note  Patient Name: Michelle Mcclain Date of Encounter: 09/08/2023  Primary Cardiologist: None  Subjective   No acute events overnight.  No symptoms.  Telemetry reviewed, showed sinus tachycardia, HR in 100s.  Inpatient Medications    Scheduled Meds:  atorvastatin  80 mg Oral Daily   insulin aspart  0-5 Units Subcutaneous QHS   insulin aspart  0-9 Units Subcutaneous TID WC   memantine  10 mg Oral BID   potassium chloride  40 mEq Oral BID   Continuous Infusions:  cefTRIAXone (ROCEPHIN)  IV 1 g (09/08/23 0914)   heparin 900 Units/hr (09/07/23 2128)   PRN Meds: acetaminophen, melatonin, polyethylene glycol, prochlorperazine   Vital Signs    Vitals:   09/07/23 1229 09/07/23 1652 09/07/23 1958 09/08/23 0507  BP: 96/63 137/77 103/62 113/72  Pulse: 100 (!) 105 99 (!) 106  Resp: 18 18 18 16   Temp: 98.3 F (36.8 C) 97.9 F (36.6 C) 97.9 F (36.6 C) 97.6 F (36.4 C)  TempSrc: Oral Oral    SpO2: 96% 96% 99% 96%  Weight:      Height:        Intake/Output Summary (Last 24 hours) at 09/08/2023 0954 Last data filed at 09/08/2023 0500 Gross per 24 hour  Intake 197.59 ml  Output 750 ml  Net -552.41 ml   Filed Weights   09/05/23 1541 09/06/23 0806  Weight: 97 kg 75.8 kg    Telemetry     Personally reviewed, sinus tachycardia, 100s  ECG    Not performed today  Physical Exam   GEN: No acute distress.   Neck: No JVD. Cardiac: RRR, no murmur, rub, or gallop.  Respiratory: Nonlabored. Clear to auscultation bilaterally. GI: Soft, nontender, bowel sounds present. MS: No edema; No deformity. Neuro:  Nonfocal. Psych: AAO times self  Labs    Chemistry Recent Labs  Lab 09/06/23 0545 09/07/23 0408 09/08/23 0442  NA 145 142 141  K 4.1 3.6 3.1*  CL 107 107 105  CO2 23 23 23   GLUCOSE 106* 124* 105*  BUN 33* 18 15  CREATININE 0.90 0.85 0.88  CALCIUM 9.5 8.9 8.8*  PROT 7.7 6.9 6.8  ALBUMIN 3.4* 2.9* 2.6*  AST 29 26 24   ALT 20 17 16    ALKPHOS 53 49 44  BILITOT 0.9 0.8 0.7  GFRNONAA >60 >60 >60  ANIONGAP 15 12 13      Hematology Recent Labs  Lab 09/06/23 0545 09/07/23 0408 09/08/23 0442  WBC 11.3* 14.2* 11.9*  RBC 5.76* 5.24* 4.66  HGB 13.9 12.6 11.2*  HCT 46.8* 42.3 37.3  MCV 81.3 80.7 80.0  MCH 24.1* 24.0* 24.0*  MCHC 29.7* 29.8* 30.0  RDW 13.9 14.0 14.1  PLT 416* 396 361    Cardiac Enzymes Recent Labs  Lab 09/05/23 1616 09/05/23 1758 09/07/23 0856 09/07/23 1041  TROPONINIHS 4 8 671* 554*    BNPNo results for input(s): "BNP", "PROBNP" in the last 168 hours.   DDimer Recent Labs  Lab 09/07/23 0408  DDIMER 2.41*     Radiology    ECHOCARDIOGRAM COMPLETE Result Date: 09/07/2023    ECHOCARDIOGRAM REPORT   Patient Name:   Michelle Mcclain Date of Exam: 09/07/2023 Medical Rec #:  [id number]            Height:       68.0 in Accession #:    [id number]           Weight:       167.1 lb  Date of Birth:  August 02, 1941             BSA:          1.893 m Patient Age:    82 years             BP:           111/75 mmHg Patient Gender: F                    HR:           71 bpm. [facility] Procedure: 2D Echo, Color Doppler, Cardiac Doppler and Intracardiac            Opacification Agent Indications:    R07.9* Chest pain, unspecified  History:        Patient has no prior history of Echocardiogram examinations.                 Risk Factors:Hypertension, Diabetes and Dyslipidemia.  Sonographer:    Irving Burton Senior RDCS Referring Phys: [id number] CHRISTOPHER P DANFORD IMPRESSIONS  1. Left ventricular ejection fraction, by estimation, is 30 to 35%. The left ventricle has moderately decreased function. 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 There is mild left ventricular hypertrophy. Indeterminate diastolic filling due to E-A fusion.  2. Right ventricular systolic function is hyperdynamic. The right ventricular size is mildly  enlarged.  3. The mitral valve is normal in structure. No evidence of mitral valve regurgitation. No evidence of mitral stenosis.  4. The aortic valve was not well visualized. Aortic valve regurgitation is not visualized. No aortic stenosis is present.  5. The inferior vena cava is normal in size but collapsibility could not be evaluated. Comparison(s): No prior Echocardiogram. FINDINGS  Left Ventricle: Left ventricular ejection fraction, by estimation, is 30 to 35%. The left ventricle has moderately decreased function. The left ventricle demonstrates regional wall motion abnormalities. The left ventricular internal cavity size was normal in size. There is mild left ventricular hypertrophy. Indeterminate diastolic filling due to E-A fusion.  LV Wall Scoring: The entire anterior wall, mid and distal anterior septum, entire apex, mid and distal inferior wall, mid anterolateral segment, and mid inferoseptal segment are akinetic. The basal anteroseptal segment, basal inferolateral segment, basal anterolateral segment, basal inferior segment, and basal inferoseptal segment are normal. Right Ventricle: The right ventricular size is mildly enlarged. No increase in right ventricular wall thickness. Right ventricular systolic function is hyperdynamic. Left Atrium: Left atrial size was normal in size. Right Atrium: Right atrial size was normal in size. Pericardium: There is no evidence of pericardial effusion. Mitral Valve: The mitral valve is normal in structure. No evidence of mitral valve regurgitation. No evidence of mitral valve stenosis. Tricuspid Valve: The tricuspid valve is grossly normal. Tricuspid valve regurgitation is trivial. No evidence of tricuspid stenosis. Aortic Valve: The aortic valve was not well visualized. Aortic valve regurgitation is not visualized. No aortic stenosis is present. Pulmonic Valve: The pulmonic valve was not well visualized. Pulmonic valve regurgitation is not visualized. No evidence of  pulmonic stenosis. Aorta: The aortic root and ascending aorta are structurally normal, with no evidence of dilitation. Venous: The inferior vena cava is normal in size but collapsibility could not be evaluated. IAS/Shunts: No atrial level shunt detected by color flow Doppler.  LEFT VENTRICLE PLAX 2D LVIDd:         3.38 cm LVIDs:  2.20 cm LV PW:         1.23 cm LV IVS:        1.32 cm LVOT diam:     1.80 cm LV SV:         41 LV SV Index:   22 LVOT Area:     2.54 cm  LV Volumes (MOD) LV vol d, MOD A2C: 87.5 ml LV vol d, MOD A4C: 88.1 ml LV vol s, MOD A2C: 51.9 ml LV vol s, MOD A4C: 65.4 ml LV SV MOD A2C:     35.7 ml LV SV MOD A4C:     88.1 ml LV SV MOD BP:      34.7 ml RIGHT VENTRICLE RV S prime:     12.60 cm/s TAPSE (M-mode): 1.9 cm LEFT ATRIUM           Index        RIGHT ATRIUM           Index LA diam:      2.70 cm 1.43 cm/m   RA Area:     12.30 cm LA Vol (A2C): 43.3 ml 22.87 ml/m  RA Volume:   24.90 ml  13.15 ml/m LA Vol (A4C): 26.9 ml 14.21 ml/m  AORTIC VALVE LVOT Vmax:   91.40 cm/s LVOT Vmean:  73.100 cm/s LVOT VTI:    0.161 m  AORTA Ao Root diam: 2.60 cm Ao Asc diam:  2.50 cm TRICUSPID VALVE TR Peak grad:   17.3 mmHg TR Vmax:        208.00 cm/s  SHUNTS Systemic VTI:  0.16 m Systemic Diam: 1.80 cm Rohnan Bartleson Priya Trinten Boudoin Electronically signed by Winfield Rast Kellianne Ek Signature Date/Time: 09/07/2023/2:05:30 PM    Final    CT ABDOMEN PELVIS W CONTRAST Result Date: 09/07/2023 CLINICAL DATA:  Pulmonary embolism (PE) suspected, high prob; Abdominal pain, acute, nonlocalized EXAM: CT ANGIOGRAPHY CHEST CT ABDOMEN AND PELVIS WITH CONTRAST TECHNIQUE: Multidetector CT imaging of the chest was performed using the standard protocol during bolus administration of intravenous contrast. Multiplanar CT image reconstructions and MIPs were obtained to evaluate the vascular anatomy. Multidetector CT imaging of the abdomen and pelvis was performed using the standard protocol during bolus administration of  intravenous contrast. RADIATION DOSE REDUCTION: This exam was performed according to the departmental dose-optimization program which includes automated exposure control, adjustment of the mA and/or kV according to patient size and/or use of iterative reconstruction technique. CONTRAST:  75mL OMNIPAQUE IOHEXOL 350 MG/ML SOLN; OMNIPAQUE IOHEXOL 300 MG/ML SOLN, 30mL OMNIPAQUE IOHEXOL 300 MG/ML SOLN COMPARISON:  09/05/2023 FINDINGS: CTA CHEST FINDINGS Cardiovascular: Satisfactory opacification of the pulmonary arteries. Filling defects within segmental and subsegmental branch pulmonary arteries within the right lower lobe. No central pulmonary embolism. No evidence of right heart strain. Heart size within normal limits. No significant pericardial effusion. Thoracic aorta is nonaneurysmal. Atherosclerotic calcifications of the aorta and coronary arteries. Mediastinum/Nodes: No enlarged mediastinal, hilar, or axillary lymph nodes. Thyroid gland, trachea, and esophagus demonstrate no significant findings. Tiny hiatal hernia. Lungs/Pleura: Increased dependent bibasilar subsegmental atelectasis. No pleural effusion or pneumothorax. Musculoskeletal: No chest wall abnormality. No acute or significant osseous findings. Degenerative spurring within the midthoracic spine. Review of the MIP images confirms the above findings. CT ABDOMEN and PELVIS FINDINGS Hepatobiliary: No focal liver abnormality is seen. No gallstones, gallbladder wall thickening, or biliary dilatation. Pancreas: Unremarkable. No pancreatic ductal dilatation or surrounding inflammatory changes. Spleen: Normal in size without focal abnormality. Adrenals/Urinary Tract: Adrenal glands are unremarkable. Perinephric stranding anterior  to the right kidney, increased from prior. Kidneys enhance symmetrically. Kidneys are otherwise normal, without renal calculi, solid lesion, or hydronephrosis. Bladder is unremarkable. Stomach/Bowel: Tiny hiatal hernia. Similar  degree of abnormal thickening of the gastric cardia (series 2, image 20). Appendix not identified. Colonic diverticulosis. No evidence of bowel wall thickening, distention, or inflammatory changes. Vascular/Lymphatic: Aortic atherosclerosis. No enlarged abdominal or pelvic lymph nodes. Reproductive: Status post hysterectomy. No adnexal masses. Other: No free fluid. No abdominopelvic fluid collection. No pneumoperitoneum. No abdominal wall hernia. Musculoskeletal: No acute or significant osseous findings. Degenerative disc disease of L5-S1. Multilevel lumbar facet arthropathy. Review of the MIP images confirms the above findings. IMPRESSION: 1. Examination positive for pulmonary embolism involving segmental and subsegmental branch pulmonary arteries within the right lower lobe. No central pulmonary embolism. No evidence of right heart strain. 2. Increased perinephric stranding anterior to the right kidney, which may be secondary to urinary tract infection. 3. Similar degree of abnormal thickening of the gastric cardia. Underlying mass not excluded. Correlate with endoscopy. 4. Colonic diverticulosis without evidence of acute diverticulitis. 5. Aortic and coronary artery atherosclerosis (ICD10-I70.0). These results will be called to the ordering clinician or representative by the Radiologist Assistant, and communication documented in the PACS or [employer]. Electronically Signed   By: Duanne Guess D.O.   On: 09/07/2023 11:33   CT Angio Chest Pulmonary Embolism (PE) W or WO Contrast Result Date: 09/07/2023 CLINICAL DATA:  Pulmonary embolism (PE) suspected, high prob; Abdominal pain, acute, nonlocalized EXAM: CT ANGIOGRAPHY CHEST CT ABDOMEN AND PELVIS WITH CONTRAST TECHNIQUE: Multidetector CT imaging of the chest was performed using the standard protocol during bolus administration of intravenous contrast. Multiplanar CT image reconstructions and MIPs were obtained to evaluate the vascular anatomy.  Multidetector CT imaging of the abdomen and pelvis was performed using the standard protocol during bolus administration of intravenous contrast. RADIATION DOSE REDUCTION: This exam was performed according to the departmental dose-optimization program which includes automated exposure control, adjustment of the mA and/or kV according to patient size and/or use of iterative reconstruction technique. CONTRAST:  75mL OMNIPAQUE IOHEXOL 350 MG/ML SOLN; OMNIPAQUE IOHEXOL 300 MG/ML SOLN, 30mL OMNIPAQUE IOHEXOL 300 MG/ML SOLN COMPARISON:  09/05/2023 FINDINGS: CTA CHEST FINDINGS Cardiovascular: Satisfactory opacification of the pulmonary arteries. Filling defects within segmental and subsegmental branch pulmonary arteries within the right lower lobe. No central pulmonary embolism. No evidence of right heart strain. Heart size within normal limits. No significant pericardial effusion. Thoracic aorta is nonaneurysmal. Atherosclerotic calcifications of the aorta and coronary arteries. Mediastinum/Nodes: No enlarged mediastinal, hilar, or axillary lymph nodes. Thyroid gland, trachea, and esophagus demonstrate no significant findings. Tiny hiatal hernia. Lungs/Pleura: Increased dependent bibasilar subsegmental atelectasis. No pleural effusion or pneumothorax. Musculoskeletal: No chest wall abnormality. No acute or significant osseous findings. Degenerative spurring within the midthoracic spine. Review of the MIP images confirms the above findings. CT ABDOMEN and PELVIS FINDINGS Hepatobiliary: No focal liver abnormality is seen. No gallstones, gallbladder wall thickening, or biliary dilatation. Pancreas: Unremarkable. No pancreatic ductal dilatation or surrounding inflammatory changes. Spleen: Normal in size without focal abnormality. Adrenals/Urinary Tract: Adrenal glands are unremarkable. Perinephric stranding anterior to the right kidney, increased from prior. Kidneys enhance symmetrically. Kidneys are otherwise normal,  without renal calculi, solid lesion, or hydronephrosis. Bladder is unremarkable. Stomach/Bowel: Tiny hiatal hernia. Similar degree of abnormal thickening of the gastric cardia (series 2, image 20). Appendix not identified. Colonic diverticulosis. No evidence of bowel wall thickening, distention, or inflammatory changes. Vascular/Lymphatic: Aortic atherosclerosis. No enlarged abdominal  or pelvic lymph nodes. Reproductive: Status post hysterectomy. No adnexal masses. Other: No free fluid. No abdominopelvic fluid collection. No pneumoperitoneum. No abdominal wall hernia. Musculoskeletal: No acute or significant osseous findings. Degenerative disc disease of L5-S1. Multilevel lumbar facet arthropathy. Review of the MIP images confirms the above findings. IMPRESSION: 1. Examination positive for pulmonary embolism involving segmental and subsegmental branch pulmonary arteries within the right lower lobe. No central pulmonary embolism. No evidence of right heart strain. 2. Increased perinephric stranding anterior to the right kidney, which may be secondary to urinary tract infection. 3. Similar degree of abnormal thickening of the gastric cardia. Underlying mass not excluded. Correlate with endoscopy. 4. Colonic diverticulosis without evidence of acute diverticulitis. 5. Aortic and coronary artery atherosclerosis (ICD10-I70.0). These results will be called to the ordering clinician or representative by the Radiologist Assistant, and communication documented in the PACS or [employer]. Electronically Signed   By: Duanne Guess D.O.   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  Echocardiogram today after showed new onset cardiomyopathy with LVEF 30 to 35% with RWMA suggestive of Takotsubo cardiomyopathy versus LAD disease. Would not recommend LHC due to advanced dementia, AAO times self. Continue heparin drip and switch to Eliquis upon discharge.  Soft blood pressures limit initiation of GDMT.  Will challenge her with metoprolol succinate 12.5 mg once daily.  Will need limited echocardiogram in 1 to 2 months outpatient for improvement in LVEF and to confirm stress-induced cardiomyopathy.  Obtain lipid panel.  I spoke with the daughter, Pacita Vena today and provided with updates.  She verbalized understanding.  Signed, Marjo Bicker, MD  09/08/2023, 9:54 AM

## 2023-09-08 NOTE — Progress Notes (Signed)
PHARMACY - ANTICOAGULATION Pharmacy Consult for heparin Indication: pulmonary embolism Brief A/P: Heparin level subtherapeutic due to loss of IV site  No Known Allergies  Patient Measurements: Height: 5\' 8"  (172.7 cm) Weight: 75.8 kg (167 lb 1.7 oz) IBW/kg (Calculated) : 63.9 Heparin Dosing Weight: 76kg  Vital Signs: Temp: 97.6 F (36.4 C) (12/12 0507) BP: 113/72 (12/12 0507) Pulse Rate: 106 (12/12 0507)  Labs: Recent Labs    09/05/23 1758 09/06/23 0545 09/07/23 0408 09/07/23 0856 09/07/23 1041 09/07/23 2015 09/08/23 0442  HGB 14.1 13.9 12.6  --   --   --  11.2*  HCT 48.2* 46.8* 42.3  --   --   --  37.3  PLT 419* 416* 396  --   --   --  361  HEPARINUNFRC  --   --   --   --   --  0.77* <0.10*  CREATININE  --  0.90 0.85  --   --   --  0.88  TROPONINIHS 8  --   --  671* 554*  --   --     Estimated Creatinine Clearance: 51.4 mL/min (by C-G formula based on SCr of 0.88 mg/dL).  Assessment: 82 y.o. female with RLL PE for heparin.  Patient lost IV, and heparin was off for at least an hour prior to heparin level.  Goal of Therapy:  Heparin level 0.3-0.7 units/ml Monitor platelets by anticoagulation protocol: Yes   Plan:  Heparin 3000 units IV bolus, then continue  heparin  900 units/hr Check heparin level in 8 hours.   Geannie Risen, PharmD, BCPS

## 2023-09-08 NOTE — NC FL2 (Signed)
[place] MEDICAID FL2 LEVEL OF CARE FORM     IDENTIFICATION  Patient Name: Michelle Mcclain Birthdate: [birth year] Sex: female Admission Date (Current Location): 09/05/2023  [facility] and [place] Number:  [employer] and Address:  [facility],  [address] [place] [address]      Provider Number: [id number]  Attending Physician Name and Address:  Alberteen Sam, *  Relative Name and Phone Number:       Current Level of Care: Hospital Recommended Level of Care: Skilled Nursing Facility Prior Approval Number:    Date Approved/Denied:   PASRR Number: [id number]  Discharge Plan: SNF    Current Diagnoses: Patient Active Problem List   Diagnosis Date Noted   Cardiomyopathy (HCC) 09/08/2023   Hypokalemia 09/08/2023   NSTEMI (non-ST elevated myocardial infarction) (HCC) 09/08/2023   Acute cystitis without hematuria 09/08/2023   Acute pulmonary embolism (HCC) 09/07/2023   Sepsis secondary to UTI (HCC) 09/06/2023   Abnormal CT of the abdomen 09/06/2023   Hypernatremia 09/06/2023   Hypercalcemia 09/06/2023   AKI (acute kidney injury) (HCC) 09/06/2023   Essential hypertension 09/06/2023   Mixed hyperlipidemia 09/06/2023   Controlled type 2 diabetes mellitus without complication, without long-term current use of insulin (HCC) 09/06/2023   Acute metabolic encephalopathy 09/06/2023   Dementia without behavioral disturbance (HCC) 09/06/2023   Pressure injury of skin 09/06/2023    Orientation RESPIRATION BLADDER Height & Weight     Self  Normal Incontinent Weight: 167 lb 1.7 oz (75.8 kg) Height:  5\' 8"  (172.7 cm)  BEHAVIORAL SYMPTOMS/MOOD NEUROLOGICAL BOWEL NUTRITION STATUS      Incontinent Diet (see dc summary)  AMBULATORY STATUS COMMUNICATION OF NEEDS Skin   Extensive Assist Verbally PU Stage and Appropriate Care   PU Stage 2 Dressing: Daily (medial buttocks)                   Personal Care Assistance Level of Assistance   Bathing, Feeding, Dressing Bathing Assistance: Maximum assistance Feeding assistance: Limited assistance Dressing Assistance: Limited assistance     Functional Limitations Info  Sight, Hearing, Speech Sight Info: Adequate Hearing Info: Adequate Speech Info: Adequate    SPECIAL CARE FACTORS FREQUENCY  PT (By licensed PT), OT (By licensed OT)     PT Frequency: 5x week OT Frequency: 5x week            Contractures Contractures Info: Not present    Additional Factors Info  Code Status, Allergies Code Status Info: Full Allergies Info: NKA           Current Medications (09/08/2023):  This is the current hospital active medication list Current Facility-Administered Medications  Medication Dose Route Frequency Provider Last Rate Last Admin   acetaminophen (TYLENOL) tablet 650 mg  650 mg Oral Q6H PRN Dow Adolph N, DO   650 mg at 09/06/23 1110   atorvastatin (LIPITOR) tablet 80 mg  80 mg Oral Daily Alberteen Sam, MD   80 mg at 09/08/23 0914   cefTRIAXone (ROCEPHIN) 1 g in sodium chloride 0.9 % 100 mL IVPB  1 g Intravenous Q24H Dow Adolph N, DO 200 mL/hr at 09/08/23 0914 1 g at 09/08/23 0914   heparin ADULT infusion 100 units/mL (25000 units/258mL)  850 Units/hr Intravenous Continuous Earnie Larsson, RPH 8.5 mL/hr at 09/08/23 1319 850 Units/hr at 09/08/23 1319   insulin aspart (novoLOG) injection 0-5 Units  0-5 Units Subcutaneous QHS Danford, Earl Lites, MD       insulin aspart (  novoLOG) injection 0-9 Units  0-9 Units Subcutaneous TID WC Danford, Earl Lites, MD   1 Units at 09/07/23 1706   melatonin tablet 6 mg  6 mg Oral QHS PRN Dow Adolph N, DO       memantine (NAMENDA) tablet 10 mg  10 mg Oral BID Alberteen Sam, MD   10 mg at 09/08/23 0914   metoprolol succinate (TOPROL-XL) 24 hr tablet 12.5 mg  12.5 mg Oral Daily Mallipeddi, Vishnu P, MD   12.5 mg at 09/08/23 1321   polyethylene glycol (MIRALAX / GLYCOLAX) packet 17 g  17 g Oral Daily PRN Hall,  Carole N, DO       potassium chloride SA (KLOR-CON M) CR tablet 40 mEq  40 mEq Oral BID Alberteen Sam, MD   40 mEq at 09/08/23 0914   prochlorperazine (COMPAZINE) injection 5 mg  5 mg Intravenous Q6H PRN Darlin Drop, DO         Discharge Medications: Please see discharge summary for a list of discharge medications.  Relevant Imaging Results:  Relevant Lab Results:   Additional Information SSN: [id number]  Karn Cassis, LCSW

## 2023-09-08 NOTE — Evaluation (Signed)
Physical Therapy Evaluation Patient Details Name: Michelle Mcclain MRN: [id number] DOB: 1941/08/15 Today's Date: 09/08/2023  History of Present Illness  Michelle Mcclain is a 82 y.o. female with medical history significant for hypertension, hyperlipidemia, type 2 diabetes, diabetic polyneuropathy, who presented to the ED due to altered mental status.  Reportedly the patient has been laying in bed for the past 3 days soaked in urine.  She would not allow family members to change her due to pain in her legs.  EMS was activated.  Clinical Impression  Pt lethargic but willing participant in therapy. Pt needs mod assist for bend mobility and sit to stand.  To weak to do anything but side step at bedside at this time.  Pt will continue to benefit from skilled PT to improve her strength, balance and activity tolerance.         If plan is discharge home, recommend the following: Two people to help with walking and/or transfers;A lot of help with bathing/dressing/bathroom;Assistance with cooking/housework;Assist for transportation;Help with stairs or ramp for entrance   Can travel by private vehicle   No    Equipment Recommendations None recommended by PT  Recommendations for Other Services   OT    Functional Status Assessment Patient has had a recent decline in their functional status and demonstrates the ability to make significant improvements in function in a reasonable and predictable amount of time.     Precautions / Restrictions Precautions Precautions: Fall Restrictions Weight Bearing Restrictions Per Provider Order: No      Mobility  Bed Mobility Overal bed mobility: Needs Assistance Bed Mobility: Supine to Sit, Sit to Supine     Supine to sit: Mod assist          Transfers Overall transfer level: Needs assistance Equipment used: Rolling walker (2 wheels) Transfers: Sit to/from Stand Sit to Stand: Mod assist                 Ambulation/Gait Ambulation/Gait assistance: Min assist (min assist for shifting body wt) Gait Distance (Feet): 3 Feet (side stepping; pt fatigued) Assistive device: Rolling walker (2 wheels) Gait Pattern/deviations: Decreased step length - left, Decreased step length - right   Gait velocity interpretation: <1.8 ft/sec, indicate of risk for recurrent falls        Balance                                                             sitting fair -; standing fair -   Pertinent Vitals/Pain Pain Assessment Pain Assessment: Faces Faces Pain Scale: Hurts even more Pain Location: hips and back when moving Pain Descriptors / Indicators: Aching, Grimacing Pain Intervention(s): Limited activity within patient's tolerance    Home Living Family/patient expects to be discharged to:: Private residence Living Arrangements: Spouse/significant other Available Help at Discharge: Family Type of Home: House         Home Layout: One level Home Equipment: Agricultural consultant (2 wheels);Cane - single point      Prior Function Prior Level of Function : Independent/Modified Independent             Mobility Comments: Just recently got a walker, has not even used yet per daughter ADLs Comments: assisted     Extremity/Trunk Assessment  Lower Extremity Assessment Lower Extremity Assessment: Generalized weakness       Communication   Communication Communication: No apparent difficulties Cueing Techniques: Verbal cues;Tactile cues  Cognition Arousal: Lethargic Behavior During Therapy: WFL for tasks assessed/performed Overall Cognitive Status: Within Functional Limits for tasks assessed                                             Exercises General Exercises - Lower Extremity Ankle Circles/Pumps: AAROM, Both, 10 reps Quad Sets: 10 reps, Both Gluteal Sets: Both, 10 reps Long Arc Quad: 5 reps, AAROM Heel Slides: AAROM, 5 reps Hip ABduction/ADduction: AAROM, 5  reps, Both   Assessment/Plan    PT Assessment Patient needs continued PT services  PT Problem List Decreased strength;Decreased activity tolerance;Decreased range of motion;Decreased balance;Decreased mobility;Pain       PT Treatment Interventions DME instruction;Gait training;Functional mobility training;Therapeutic exercise;Patient/family education    PT Goals (Current goals can be found in the Care Plan section)  Acute Rehab PT Goals Patient Stated Goal: To be walking PT Goal Formulation: With patient/family Time For Goal Achievement: 09/21/23 Potential to Achieve Goals: Good    Frequency Min 2X/week        AM-PAC PT "6 Clicks" Mobility  Outcome Measure Help needed turning from your back to your side while in a flat bed without using bedrails?: A Lot Help needed moving from lying on your back to sitting on the side of a flat bed without using bedrails?: A Lot Help needed moving to and from a bed to a chair (including a wheelchair)?: A Lot Help needed standing up from a chair using your arms (e.g., wheelchair or bedside chair)?: A Lot Help needed to walk in hospital room?: A Lot Help needed climbing 3-5 steps with a railing? : Total 6 Click Score: 11    End of Session Equipment Utilized During Treatment: Gait belt Activity Tolerance: Patient limited by fatigue Patient left: in bed;with call bell/phone within reach;with nursing/sitter in room Nurse Communication: Mobility status PT Visit Diagnosis: Unsteadiness on feet (R26.81);Repeated falls (R29.6);Muscle weakness (generalized) (M62.81);History of falling (Z91.81);Pain Pain - Right/Left: Right Pain - part of body: Hip    Time: 1430-1504 PT Time Calculation (min) (ACUTE ONLY): 34 min   Charges:   PT Evaluation $PT Eval Low Complexity: 1 Low PT Treatments $Therapeutic Exercise: 8-22 mins PT General Charges $$ ACUTE PT VISIT: 1 Visit       Virgina Organ, PT CLT [phone]  09/08/2023, 3:04 PM

## 2023-09-08 NOTE — Plan of Care (Signed)
  Problem: Education: Goal: Knowledge of General Education information will improve Description: Including pain rating scale, medication(s)/side effects and non-pharmacologic comfort measures Outcome: Progressing   Problem: Clinical Measurements: Goal: Respiratory complications will improve Outcome: Progressing   Problem: Coping: Goal: Level of anxiety will decrease Outcome: Progressing   

## 2023-09-08 NOTE — Plan of Care (Signed)

## 2023-09-08 NOTE — Telephone Encounter (Signed)

## 2023-09-08 NOTE — Plan of Care (Signed)
  Problem: Acute Rehab PT Goals(only PT should resolve) Goal: Pt Will Go Sit To Supine/Side Flowsheets (Taken 09/08/2023 1508) Pt will go Sit to Supine/Side: with contact guard assist Goal: Patient Will Transfer Sit To/From Stand Flowsheets (Taken 09/08/2023 1508) Patient will transfer sit to/from stand: with contact guard assist Goal: Pt Will Ambulate Flowsheets (Taken 09/08/2023 1508) Pt will Ambulate:  15 feet  with minimal assist  with rolling walker Goal: Pt/caregiver will Perform Home Exercise Program Flowsheets (Taken 09/08/2023 1508) Pt/caregiver will Perform Home Exercise Program: For increased strengthening

## 2023-09-09 DIAGNOSIS — A419 Sepsis, unspecified organism: Secondary | ICD-10-CM | POA: Diagnosis not present

## 2023-09-09 DIAGNOSIS — I5181 Takotsubo syndrome: Secondary | ICD-10-CM | POA: Diagnosis not present

## 2023-09-09 DIAGNOSIS — E119 Type 2 diabetes mellitus without complications: Secondary | ICD-10-CM

## 2023-09-09 DIAGNOSIS — I5A Non-ischemic myocardial injury (non-traumatic): Secondary | ICD-10-CM | POA: Diagnosis not present

## 2023-09-09 DIAGNOSIS — I2699 Other pulmonary embolism without acute cor pulmonale: Secondary | ICD-10-CM

## 2023-09-09 DIAGNOSIS — F0390 Unspecified dementia without behavioral disturbance: Secondary | ICD-10-CM | POA: Diagnosis not present

## 2023-09-09 LAB — GLUCOSE, CAPILLARY
Glucose-Capillary: 91 mg/dL (ref 70–99)
Glucose-Capillary: 102 mg/dL — ABNORMAL HIGH (ref 70–99)
Glucose-Capillary: 88 mg/dL (ref 70–99)

## 2023-09-09 LAB — CBC
HCT: 35.8 % — ABNORMAL LOW (ref 36.0–46.0)
Hemoglobin: 10.9 g/dL — ABNORMAL LOW (ref 12.0–15.0)
MCH: 24.6 pg — ABNORMAL LOW (ref 26.0–34.0)
MCHC: 30.4 g/dL (ref 30.0–36.0)
MCV: 80.8 fL (ref 80.0–100.0)
Platelets: 355 10*3/uL (ref 150–400)
RBC: 4.43 MIL/uL (ref 3.87–5.11)
RDW: 14.2 % (ref 11.5–15.5)
WBC: 9.3 10*3/uL (ref 4.0–10.5)
nRBC: 0 % (ref 0.0–0.2)

## 2023-09-09 LAB — LIPID PANEL
Cholesterol: 108 mg/dL (ref 0–200)
HDL: 31 mg/dL — ABNORMAL LOW (ref >40)
LDL Cholesterol: 59 mg/dL (ref 0–99)
Total CHOL/HDL Ratio: 3.5 {ratio}
Triglycerides: 88 mg/dL (ref <150)
VLDL: 18 mg/dL (ref 0–40)

## 2023-09-09 LAB — HEPARIN LEVEL (UNFRACTIONATED): Heparin Unfractionated: 0.45 [IU]/mL (ref 0.30–0.70)

## 2023-09-09 LAB — POTASSIUM: Potassium: 3.7 mmol/L (ref 3.5–5.1)

## 2023-09-09 MED ORDER — APIXABAN 5 MG PO TABS
10.0000 mg | ORAL_TABLET | Freq: Two times a day (BID) | ORAL | Status: DC
  Administered 2023-09-09 – 2023-09-10 (×2): 10 mg via ORAL
  Filled 2023-09-09 (×2): qty 2

## 2023-09-09 MED ORDER — APIXABAN 5 MG PO TABS: 5.0000 mg | ORAL_TABLET | Freq: Two times a day (BID) | ORAL | Status: DC

## 2023-09-09 NOTE — Progress Notes (Signed)
PHARMACY - ANTICOAGULATION Pharmacy Consult for heparin Indication: pulmonary embolism  No Known Allergies  Patient Measurements: Height: 5\' 8"  (172.7 cm) Weight: 75.8 kg (167 lb 1.7 oz) IBW/kg (Calculated) : 63.9 Heparin Dosing Weight: 76kg  Vital Signs: Temp: 98 F (36.7 C) (12/13 0458) Temp Source: Oral (12/12 2008) BP: 108/74 (12/13 0458) Pulse Rate: 88 (12/13 0458)  Labs: Recent Labs    09/07/23 0408 09/07/23 0856 09/07/23 1041 09/07/23 2015 09/08/23 0442 09/08/23 1146 09/09/23 0442  HGB 12.6  --   --   --  11.2*  --  10.9*  HCT 42.3  --   --   --  37.3  --  35.8*  PLT 396  --   --   --  361  --  355  HEPARINUNFRC  --   --   --    < > <0.10* 0.67 0.45  CREATININE 0.85  --   --   --  0.88  --   --   TROPONINIHS  --  671* 554*  --   --   --   --    < > = values in this interval not displayed.    Estimated Creatinine Clearance: 51.4 mL/min (by C-G formula based on SCr of 0.88 mg/dL).  Assessment: 82 y.o. female with RLL PE for heparin.   HL 0.45- therapeutic CBC WNL  Goal of Therapy:  Heparin level 0.3-0.7 units/ml Monitor platelets by anticoagulation protocol: Yes   Plan:  Continue heparin at 850 units/hr Recheck in am Plan for transition to doac 12/13  Judeth Cornfield, PharmD Clinical Pharmacist 09/09/2023 7:46 AM

## 2023-09-09 NOTE — Progress Notes (Signed)
Progress Note  Patient Name: Michelle Mcclain Date of Encounter: 09/09/2023  Primary Cardiologist: None  Subjective   No acute events overnight, no symptoms.  Telemetry reviewed, showed normal sinus rhythm.  Inpatient Medications    Scheduled Meds:  atorvastatin  80 mg Oral Daily   insulin aspart  0-5 Units Subcutaneous QHS   insulin aspart  0-9 Units Subcutaneous TID WC   memantine  10 mg Oral BID   metoprolol succinate  12.5 mg Oral Daily   Continuous Infusions:  cefTRIAXone (ROCEPHIN)  IV 1 g (09/08/23 0914)   heparin 850 Units/hr (09/08/23 1319)   PRN Meds: acetaminophen, melatonin, polyethylene glycol, prochlorperazine   Vital Signs    Vitals:   09/08/23 0507 09/08/23 1200 09/08/23 2008 09/09/23 0458  BP: 113/72 111/66 126/83 108/74  Pulse: (!) 106 96 99 88  Resp: 16  16 18   Temp: 97.6 F (36.4 C)  97.7 F (36.5 C) 98 F (36.7 C)  TempSrc:   Oral   SpO2: 96%  99% 98%  Weight:      Height:        Intake/Output Summary (Last 24 hours) at 09/09/2023 0945 Last data filed at 09/09/2023 0500 Gross per 24 hour  Intake 240 ml  Output 500 ml  Net -260 ml   Filed Weights   09/05/23 1541 09/06/23 0806  Weight: 97 kg 75.8 kg    Telemetry     Personally reviewed, NSR with no alarms.  ECG    Not performed today  Physical Exam   GEN: No acute distress.   Neck: No JVD. Cardiac: RRR, no murmur, rub, or gallop.  Respiratory: Nonlabored. Clear to auscultation bilaterally. GI: Soft, nontender, bowel sounds present. MS: No edema; No deformity. Neuro:  Nonfocal. Psych: AAO times self  Labs    Chemistry Recent Labs  Lab 09/06/23 0545 09/07/23 0408 09/08/23 0442 09/09/23 0442  NA 145 142 141  --   K 4.1 3.6 3.1* 3.7  CL 107 107 105  --   CO2 23 23 23   --   GLUCOSE 106* 124* 105*  --   BUN 33* 18 15  --   CREATININE 0.90 0.85 0.88  --   CALCIUM 9.5 8.9 8.8*  --   PROT 7.7 6.9 6.8  --   ALBUMIN 3.4* 2.9* 2.6*  --   AST 29 26 24   --    ALT 20 17 16   --   ALKPHOS 53 49 44  --   BILITOT 0.9 0.8 0.7  --   GFRNONAA >60 >60 >60  --   ANIONGAP 15 12 13   --      Hematology Recent Labs  Lab 09/07/23 0408 09/08/23 0442 09/09/23 0442  WBC 14.2* 11.9* 9.3  RBC 5.24* 4.66 4.43  HGB 12.6 11.2* 10.9*  HCT 42.3 37.3 35.8*  MCV 80.7 80.0 80.8  MCH 24.0* 24.0* 24.6*  MCHC 29.8* 30.0 30.4  RDW 14.0 14.1 14.2  PLT 396 361 355    Cardiac Enzymes Recent Labs  Lab 09/05/23 1616 09/05/23 1758 09/07/23 0856 09/07/23 1041  TROPONINIHS 4 8 671* 554*    BNPNo results for input(s): "BNP", "PROBNP" in the last 168 hours.   DDimer Recent Labs  Lab 09/07/23 0408  DDIMER 2.41*     Radiology    ECHOCARDIOGRAM COMPLETE Result Date: 09/07/2023    ECHOCARDIOGRAM REPORT   Patient Name:   Michelle Mcclain Date of Exam: 09/07/2023 Medical Rec #:  [id number]  Height:       68.0 in Accession #:    [id number]           Weight:       167.1 lb Date of Birth:  Mar 30, 1941             BSA:          1.893 m Patient Age:    82 years             BP:           111/75 mmHg Patient Gender: F                    HR:           71 bpm. [facility] Procedure: 2D Echo, Color Doppler, Cardiac Doppler and Intracardiac            Opacification Agent Indications:    R07.9* Chest pain, unspecified  History:        Patient has no prior history of Echocardiogram examinations.                 Risk Factors:Hypertension, Diabetes and Dyslipidemia.  Sonographer:    Irving Burton Senior RDCS Referring Phys: [id number] CHRISTOPHER P DANFORD IMPRESSIONS  1. Left ventricular ejection fraction, by estimation, is 30 to 35%. The left ventricle has moderately decreased function. 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 There is mild left ventricular hypertrophy. Indeterminate diastolic filling due to E-A fusion.  2. Right ventricular systolic function is hyperdynamic. The  right ventricular size is mildly enlarged.  3. The mitral valve is normal in structure. No evidence of mitral valve regurgitation. No evidence of mitral stenosis.  4. The aortic valve was not well visualized. Aortic valve regurgitation is not visualized. No aortic stenosis is present.  5. The inferior vena cava is normal in size but collapsibility could not be evaluated. Comparison(s): No prior Echocardiogram. FINDINGS  Left Ventricle: Left ventricular ejection fraction, by estimation, is 30 to 35%. The left ventricle has moderately decreased function. The left ventricle demonstrates regional wall motion abnormalities. The left ventricular internal cavity size was normal in size. There is mild left ventricular hypertrophy. Indeterminate diastolic filling due to E-A fusion.  LV Wall Scoring: The entire anterior wall, mid and distal anterior septum, entire apex, mid and distal inferior wall, mid anterolateral segment, and mid inferoseptal segment are akinetic. The basal anteroseptal segment, basal inferolateral segment, basal anterolateral segment, basal inferior segment, and basal inferoseptal segment are normal. Right Ventricle: The right ventricular size is mildly enlarged. No increase in right ventricular wall thickness. Right ventricular systolic function is hyperdynamic. Left Atrium: Left atrial size was normal in size. Right Atrium: Right atrial size was normal in size. Pericardium: There is no evidence of pericardial effusion. Mitral Valve: The mitral valve is normal in structure. No evidence of mitral valve regurgitation. No evidence of mitral valve stenosis. Tricuspid Valve: The tricuspid valve is grossly normal. Tricuspid valve regurgitation is trivial. No evidence of tricuspid stenosis. Aortic Valve: The aortic valve was not well visualized. Aortic valve regurgitation is not visualized. No aortic stenosis is present. Pulmonic Valve: The pulmonic valve was not well visualized. Pulmonic valve regurgitation  is not visualized. No evidence of pulmonic stenosis. Aorta: The aortic root and ascending aorta are structurally normal, with no evidence of dilitation. Venous: The inferior vena cava is normal in size but collapsibility could not be evaluated. IAS/Shunts:  No atrial level shunt detected by color flow Doppler.  LEFT VENTRICLE PLAX 2D LVIDd:         3.38 cm LVIDs:         2.20 cm LV PW:         1.23 cm LV IVS:        1.32 cm LVOT diam:     1.80 cm LV SV:         41 LV SV Index:   22 LVOT Area:     2.54 cm  LV Volumes (MOD) LV vol d, MOD A2C: 87.5 ml LV vol d, MOD A4C: 88.1 ml LV vol s, MOD A2C: 51.9 ml LV vol s, MOD A4C: 65.4 ml LV SV MOD A2C:     35.7 ml LV SV MOD A4C:     88.1 ml LV SV MOD BP:      34.7 ml RIGHT VENTRICLE RV S prime:     12.60 cm/s TAPSE (M-mode): 1.9 cm LEFT ATRIUM           Index        RIGHT ATRIUM           Index LA diam:      2.70 cm 1.43 cm/m   RA Area:     12.30 cm LA Vol (A2C): 43.3 ml 22.87 ml/m  RA Volume:   24.90 ml  13.15 ml/m LA Vol (A4C): 26.9 ml 14.21 ml/m  AORTIC VALVE LVOT Vmax:   91.40 cm/s LVOT Vmean:  73.100 cm/s LVOT VTI:    0.161 m  AORTA Ao Root diam: 2.60 cm Ao Asc diam:  2.50 cm TRICUSPID VALVE TR Peak grad:   17.3 mmHg TR Vmax:        208.00 cm/s  SHUNTS Systemic VTI:  0.16 m Systemic Diam: 1.80 cm Laira Penninger Priya Fayette Gasner Electronically signed by Winfield Rast Erubiel Manasco Signature Date/Time: 09/07/2023/2:05:30 PM    Final    CT Angio Chest Pulmonary Embolism (PE) W or WO Contrast Result Date: 09/07/2023 CLINICAL DATA:  Pulmonary embolism (PE) suspected, high prob; Abdominal pain, acute, nonlocalized EXAM: CT ANGIOGRAPHY CHEST CT ABDOMEN AND PELVIS WITH CONTRAST TECHNIQUE: Multidetector CT imaging of the chest was performed using the standard protocol during bolus administration of intravenous contrast. Multiplanar CT image reconstructions and MIPs were obtained to evaluate the vascular anatomy. Multidetector CT imaging of the abdomen and pelvis was performed using  the standard protocol during bolus administration of intravenous contrast. RADIATION DOSE REDUCTION: This exam was performed according to the departmental dose-optimization program which includes automated exposure control, adjustment of the mA and/or kV according to patient size and/or use of iterative reconstruction technique. CONTRAST:  75mL OMNIPAQUE IOHEXOL 350 MG/ML SOLN; OMNIPAQUE IOHEXOL 300 MG/ML SOLN, 30mL OMNIPAQUE IOHEXOL 300 MG/ML SOLN COMPARISON:  09/05/2023 FINDINGS: CTA CHEST FINDINGS Cardiovascular: Satisfactory opacification of the pulmonary arteries. Filling defects within segmental and subsegmental branch pulmonary arteries within the right lower lobe. No central pulmonary embolism. No evidence of right heart strain. Heart size within normal limits. No significant pericardial effusion. Thoracic aorta is nonaneurysmal. Atherosclerotic calcifications of the aorta and coronary arteries. Mediastinum/Nodes: No enlarged mediastinal, hilar, or axillary lymph nodes. Thyroid gland, trachea, and esophagus demonstrate no significant findings. Tiny hiatal hernia. Lungs/Pleura: Increased dependent bibasilar subsegmental atelectasis. No pleural effusion or pneumothorax. Musculoskeletal: No chest wall abnormality. No acute or significant osseous findings. Degenerative spurring within the midthoracic spine. Review of the MIP images confirms the above findings. CT ABDOMEN and PELVIS FINDINGS Hepatobiliary: No  focal liver abnormality is seen. No gallstones, gallbladder wall thickening, or biliary dilatation. Pancreas: Unremarkable. No pancreatic ductal dilatation or surrounding inflammatory changes. Spleen: Normal in size without focal abnormality. Adrenals/Urinary Tract: Adrenal glands are unremarkable. Perinephric stranding anterior to the right kidney, increased from prior. Kidneys enhance symmetrically. Kidneys are otherwise normal, without renal calculi, solid lesion, or hydronephrosis. Bladder is  unremarkable. Stomach/Bowel: Tiny hiatal hernia. Similar degree of abnormal thickening of the gastric cardia (series 2, image 20). Appendix not identified. Colonic diverticulosis. No evidence of bowel wall thickening, distention, or inflammatory changes. Vascular/Lymphatic: Aortic atherosclerosis. No enlarged abdominal or pelvic lymph nodes. Reproductive: Status post hysterectomy. No adnexal masses. Other: No free fluid. No abdominopelvic fluid collection. No pneumoperitoneum. No abdominal wall hernia. Musculoskeletal: No acute or significant osseous findings. Degenerative disc disease of L5-S1. Multilevel lumbar facet arthropathy. Review of the MIP images confirms the above findings. IMPRESSION: 1. Examination positive for pulmonary embolism involving segmental and subsegmental branch pulmonary arteries within the right lower lobe. No central pulmonary embolism. No evidence of right heart strain. 2. Increased perinephric stranding anterior to the right kidney, which may be secondary to urinary tract infection. 3. Similar degree of abnormal thickening of the gastric cardia. Underlying mass not excluded. Correlate with endoscopy. 4. Colonic diverticulosis without evidence of acute diverticulitis. 5. Aortic and coronary artery atherosclerosis (ICD10-I70.0). These results will be called to the ordering clinician or representative by the Radiologist Assistant, and communication documented in the PACS or [employer]. Electronically Signed   By: Duanne Guess D.O.   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  I would not recommend LHC due to advanced dementia, AAO times self. Continue heparin drip and switch to Eliquis upon discharge.  Soft blood pressures limit initiation of GDMT.  Will continue metoprolol succinate 12.5 mg once daily as she tolerated well with no symptoms.  Will need limited echocardiogram in 1 to 2 months in the outpatient setting to assess improvement in LVEF.  Outpatient cardiology follow-up.  [facility] will sign off.   Medication Recommendations: Continue above medications Other recommendations (labs, testing, etc): None Follow up as an outpatient: Cardiology outpatient follow-up in 1 month   Signed, Infant Zink P Emmet Messer, MD  09/09/2023, 9:45 AM

## 2023-09-09 NOTE — Progress Notes (Signed)
Physical Therapy Treatment Patient Details Name: Michelle Mcclain MRN: [id number] DOB: 12-Sep-1941 Today's Date: 09/09/2023   History of Present Illness Michelle Mcclain is a 82 y.o. female with medical history significant for hypertension, hyperlipidemia, type 2 diabetes, diabetic polyneuropathy, who presented to the ED due to altered mental status.  Reportedly the patient has been laying in bed for the past 3 days soaked in urine.  She would not allow family members to change her due to pain in her legs.  EMS was activated.    PT Comments  Patient lying in bed on therapist arrival.  Patient needs max encouragement to participate with PT. Her daughter is present at bedside and encourages patient to participate.  Patient needs moderate assist for supine to sit; reports right side leg pain with movement.  Once sitting on the edge of the bed she needs only CGA for safety.  Sit to stand to RW with moderate assist and once standing needs only min assist; able to take a couple of steps over to the chair and sit down with min A to control descent.  patient left in chair with call button in reach, chair alarm set, family present and nursing notified of mobility status. Patient will benefit from continued skilled therapy services during the remainder of her hospital stay and at the next recommended venue of care to address deficits and promote return to optimal function.         If plan is discharge home, recommend the following: Two people to help with walking and/or transfers;A lot of help with bathing/dressing/bathroom;Assistance with cooking/housework;Assist for transportation;Help with stairs or ramp for entrance   Can travel by private vehicle     No  Equipment Recommendations  None recommended by PT    Recommendations for Other Services       Precautions / Restrictions Precautions Precautions: Fall Restrictions Weight Bearing Restrictions Per Provider Order: No     Mobility  Bed  Mobility Overal bed mobility: Needs Assistance Bed Mobility: Supine to Sit     Supine to sit: Mod assist          Transfers Overall transfer level: Needs assistance Equipment used: Rolling walker (2 wheels) Transfers: Sit to/from Stand, Bed to chair/wheelchair/BSC Sit to Stand: Mod assist   Step pivot transfers: Min assist            Ambulation/Gait Ambulation/Gait assistance: Min assist (min assist for shifting body wt) Gait Distance (Feet): 3 Feet Assistive device: Rolling walker (2 wheels) Gait Pattern/deviations: Decreased step length - left, Decreased step length - right           Stairs             Wheelchair Mobility     Tilt Bed    Modified Rankin (Stroke Patients Only)       Balance Overall balance assessment: Needs assistance Sitting-balance support: Feet supported, Bilateral upper extremity supported Sitting balance-Leahy Scale: Fair Sitting balance - Comments: fair sitting balance on edge of bed   Standing balance support: Bilateral upper extremity supported, During functional activity, Reliant on assistive device for balance Standing balance-Leahy Scale: Fair Standing balance comment: fair standing balance with RW and PT assistance                            Cognition Arousal: Lethargic Behavior During Therapy: WFL for tasks assessed/performed Overall Cognitive Status: Within Functional Limits for tasks assessed  Exercises      General Comments        Pertinent Vitals/Pain Pain Assessment Pain Assessment: Faces Faces Pain Scale: Hurts even more Pain Location: initially reports no pain but groans with pain with supine to sit Pain Descriptors / Indicators: Aching, Grimacing Pain Intervention(s): Monitored during session, Limited activity within patient's tolerance    Home Living                          Prior Function            PT  Goals (current goals can now be found in the care plan section) Acute Rehab PT Goals Patient Stated Goal: To be walking PT Goal Formulation: With patient/family Time For Goal Achievement: 09/21/23 Potential to Achieve Goals: Good Progress towards PT goals: Progressing toward goals    Frequency    Min 2X/week      PT Plan      Co-evaluation              AM-PAC PT "6 Clicks" Mobility   Outcome Measure  Help needed turning from your back to your side while in a flat bed without using bedrails?: A Lot Help needed moving from lying on your back to sitting on the side of a flat bed without using bedrails?: A Lot Help needed moving to and from a bed to a chair (including a wheelchair)?: A Lot Help needed standing up from a chair using your arms (e.g., wheelchair or bedside chair)?: A Lot Help needed to walk in hospital room?: A Lot Help needed climbing 3-5 steps with a railing? : Total 6 Click Score: 11    End of Session Equipment Utilized During Treatment: Gait belt Activity Tolerance: Patient limited by fatigue Patient left: in chair;with call bell/phone within reach;with chair alarm set;with family/visitor present Nurse Communication: Mobility status PT Visit Diagnosis: Unsteadiness on feet (R26.81);Repeated falls (R29.6);Muscle weakness (generalized) (M62.81);History of falling (Z91.81);Pain Pain - Right/Left: Right Pain - part of body: Leg     Time: 1610-9604 PT Time Calculation (min) (ACUTE ONLY): 27 min  Charges:    $Therapeutic Activity: 23-37 mins PT General Charges $$ ACUTE PT VISIT: 1 Visit                     12:27 PM, 09/09/23 Faryn Sieg Small Ravan Schlemmer MPT [place] [facility] [place] [phone] [phone]

## 2023-09-09 NOTE — Care Management Important Message (Signed)
Important Message  Patient Details  Name: Michelle Mcclain MRN: [id number] Date of Birth: 1941-07-10   Important Message Given:  Yes - Medicare IM (spoke with daughter Aram Beecham to review letter, no additonal copy needed)     Corey Harold 09/09/2023, 12:32 PM

## 2023-09-09 NOTE — Discharge Instructions (Signed)
Information on my medicine - ELIQUIS (apixaban)  This medication education was reviewed with me or my healthcare representative as part of my discharge preparation.   Why was Eliquis prescribed for you? Eliquis was prescribed to treat blood clots that may have been found in the veins of your legs (deep vein thrombosis) or in your lungs (pulmonary embolism) and to reduce the risk of them occurring again.  What do You need to know about Eliquis ? The starting dose is 10 mg (two 5 mg tablets) taken TWICE daily for the FIRST SEVEN (7) DAYS, then the dose is reduced to ONE 5 mg tablet taken TWICE daily.  Eliquis may be taken with or without food.   Try to take the dose about the same time in the morning and in the evening. If you have difficulty swallowing the tablet whole please discuss with your pharmacist how to take the medication safely.  Take Eliquis exactly as prescribed and DO NOT stop taking Eliquis without talking to the doctor who prescribed the medication.  Stopping may increase your risk of developing a new blood clot.  Refill your prescription before you run out.  After discharge, you should have regular check-up appointments with your healthcare provider that is prescribing your Eliquis.    What do you do if you miss a dose? If a dose of ELIQUIS is not taken at the scheduled time, take it as soon as possible on the same day and twice-daily administration should be resumed. The dose should not be doubled to make up for a missed dose.  Important Safety Information A possible side effect of Eliquis is bleeding. You should call your healthcare provider right away if you experience any of the following: Bleeding from an injury or your nose that does not stop. Unusual colored urine (red or dark brown) or unusual colored stools (red or black). Unusual bruising for unknown reasons. A serious fall or if you hit your head (even if there is no bleeding).  Some medicines may  interact with Eliquis and might increase your risk of bleeding or clotting while on Eliquis. To help avoid this, consult your healthcare provider or pharmacist prior to using any new prescription or non-prescription medications, including herbals, vitamins, non-steroidal anti-inflammatory drugs (NSAIDs) and supplements.  This website has more information on Eliquis (apixaban): http://www.eliquis.com/eliquis/home  

## 2023-09-09 NOTE — Plan of Care (Signed)

## 2023-09-09 NOTE — Progress Notes (Signed)
PT Cancellation Note  Patient Details Name: Michelle Mcclain MRN: [id number] DOB: Oct 05, 1940   Cancelled Treatment:    Reason Eval/Treat Not Completed: Fatigue/lethargy limiting ability to participate Attempted PT session, pt stated she is tired but willing to participate later today.    Becky Sax, LPTA/CLT; CBIS [phone]  Juel Burrow 09/09/2023, 8:38 AM

## 2023-09-09 NOTE — Progress Notes (Signed)
PROGRESS NOTE  Michelle Mcclain GMW:[id number] DOB: Jun 23, 1941 DOA: 09/05/2023 PCP: Butch Penny, MD (Inactive)  Brief History:  82 y.o. F with DM2, HTN, HLD and dementia, and diabetic polyneuropathy  presented with change in mentation, generalized weakness.  Reportedly the patient has been laying in bed for the past 3 days soaked in urine. She would not allow family members to change her due to pain in her legs. EMS was activated.  09/05/2023 urinalysis showed 21-50 WBC.  Urine culture was not initially done.  The patient was started on ceftriaxone on 09/05/2023 at 1807.  Urine culture was ultimately obtained on 09/06/2023 and showed <10K growth. Patient was started on intravenous ceftriaxone 09/05/23 at 1807.   12/11: New tachcyardia, troponin up 671>> 554, Cardiology consulted; CTA showed PE; Echo EF 30-35%,, RWMA Patient was started on IV heparin.  On 09/09/2023, the patient was transitioned to apixaban.  Decision was made to treat the patient medically without any invasive procedures.   Assessment/Plan:  Sepsis secondary to UTI [facility]) -Presented with AMS, weakness, leukocytosis, tachcycardia, and pyruria.  -CT showed pyelonephritis of right kidney, UA consistent with UTI. Urine culture insignificant growth.  Blood cultures no growth. - Continue Rocephin day 5 of 7   Acute pulmonary embolism (HCC) Remains asymptomatic and hemodynamically stable. -Continue heparin infusion - Transition to DOAC 12/13 evening   New Cardiomyopathy [facility]) Demand ischemia There are some uncertainty if her cardiomyopathy and regional wall motion abnormalities are for LAD plaque, or reactive to the PE, but favor the latter --Cardiology consulted, recommend against invasive strategy, we will plan for medical management alone for now. No clear heart failure symptoms at present - Continue Lipitor -soft BP limits GDMT -continue metoprolol succinate -repeat echo in 1-2 months -received 48 hours IV  heparin   Hypokalemia - Supplement K    Pressure injury of skin Buttocks, medial stage II, present on admission - Daily wound care   Dementia without behavioral disturbance (HCC) - Continue home memantine   Acute metabolic encephalopathy Delirium She remains intermittently confused - Hold Lyrica - Continue memantine - Standard delirium precautions: blinds open and lights on during day, TV off, minimize interruptions at night, glasses/hearing aids, PT/OT, avoiding Beers list medications     Controlled type 2 diabetes mellitus without complication, without long-term current use of insulin (HCC) -Glucose controlled -12/9 A1C 5.7 - Continue sliding scale corrections - Hold home metformin       Mixed hyperlipidemia -Continue Lipitor   Essential hypertension BP normal off meds - Hold hydrochlorothiazide and losartan and metoprolol for now   AKI (acute kidney injury) (HCC) Resolved -baseline creatinine 0.8-0.9 -serum creatinine peaked 1.36   Hypercalcemia Resolved   Hypernatremia Resolved   Abnormal CT of the abdomen (gastric cardia thickening) In the setting of her functional decline over the last 6 months, poor p.o. intake, and new PE, this is obviously concerning for malignancy.  Given her delirium I do not think an invasive strategy at this time is appropriate.  Family will discuss among some cells and share with me whether they would prefer workup in the future.       Family Communication:  daughter updated 12/13  Consultants:  cardiology  Code Status:  FULL  DVT Prophylaxis:  IV Heparin >>apixaban   Procedures: As Listed in Progress Note Above  Antibiotics: Ceftriaxone 12/9>>        Subjective: Patient denies fevers, chills, headache, chest pain, dyspnea, nausea, vomiting, diarrhea,  abdominal pain   Objective: Vitals:   09/08/23 2008 09/09/23 0458 09/09/23 1058 09/09/23 1402  BP: 126/83 108/74 (!) 147/78 124/70  Pulse: 99 88 98 (!) 103   Resp: 16 18    Temp: 97.7 F (36.5 C) 98 F (36.7 C)  (!) 97.3 F (36.3 C)  TempSrc: Oral   Oral  SpO2: 99% 98%  99%  Weight:      Height:        Intake/Output Summary (Last 24 hours) at 09/09/2023 1606 Last data filed at 09/09/2023 0900 Gross per 24 hour  Intake 120 ml  Output 500 ml  Net -380 ml   Weight change:  Exam:  General:  Pt is alert, follows commands appropriately, not in acute distress HEENT: No icterus, No thrush, No neck mass, [place]/AT Cardiovascular: RRR, S1/S2, no rubs, no gallops Respiratory: CTA bilaterally, no wheezing, no crackles, no rhonchi Abdomen: Soft/+BS, non tender, non distended, no guarding Extremities: No edema, No lymphangitis, No petechiae, No rashes, no synovitis   Data Reviewed: I have personally reviewed following labs and imaging studies Basic Metabolic Panel: Recent Labs  Lab 09/05/23 1616 09/06/23 0545 09/07/23 0408 09/08/23 0442 09/09/23 0442  NA 146* 145 142 141  --   K 4.5 4.1 3.6 3.1* 3.7  CL 101 107 107 105  --   CO2 20* 23 23 23   --   GLUCOSE 145* 106* 124* 105*  --   BUN 48* 33* 18 15  --   CREATININE 1.36* 0.90 0.85 0.88  --   CALCIUM 10.7* 9.5 8.9 8.8*  --   MG 2.2 1.7  --   --   --   PHOS  --  2.3*  --   --   --    Liver Function Tests: Recent Labs  Lab 09/05/23 1616 09/06/23 0545 09/07/23 0408 09/08/23 0442  AST 31 29 26 24   ALT 24 20 17 16   ALKPHOS 64 53 49 44  BILITOT 1.2* 0.9 0.8 0.7  PROT 9.3* 7.7 6.9 6.8  ALBUMIN 4.1 3.4* 2.9* 2.6*   No results for input(s): "LIPASE", "AMYLASE" in the last 168 hours. No results for input(s): "AMMONIA" in the last 168 hours. Coagulation Profile: No results for input(s): "INR", "PROTIME" in the last 168 hours. CBC: Recent Labs  Lab 09/05/23 1758 09/06/23 0545 09/07/23 0408 09/08/23 0442 09/09/23 0442  WBC 13.2* 11.3* 14.2* 11.9* 9.3  NEUTROABS 9.1*  --   --   --   --   HGB 14.1 13.9 12.6 11.2* 10.9*  HCT 48.2* 46.8* 42.3 37.3 35.8*  MCV 83.4 81.3 80.7  80.0 80.8  PLT 419* 416* 396 361 355   Cardiac Enzymes: No results for input(s): "CKTOTAL", "CKMB", "CKMBINDEX", "TROPONINI" in the last 168 hours. BNP: Invalid input(s): "POCBNP" CBG: Recent Labs  Lab 09/08/23 1131 09/08/23 1709 09/08/23 2110 09/09/23 0749 09/09/23 1133  GLUCAP 96 103* 99 91 102*   HbA1C: No results for input(s): "HGBA1C" in the last 72 hours. Urine analysis:    Component Value Date/Time   COLORURINE YELLOW 09/06/2023 1112   APPEARANCEUR HAZY (A) 09/06/2023 1112   LABSPEC 1.019 09/06/2023 1112   PHURINE 5.0 09/06/2023 1112   GLUCOSEU NEGATIVE 09/06/2023 1112   HGBUR MODERATE (A) 09/06/2023 1112   BILIRUBINUR NEGATIVE 09/06/2023 1112   KETONESUR 20 (A) 09/06/2023 1112   PROTEINUR 30 (A) 09/06/2023 1112   NITRITE NEGATIVE 09/06/2023 1112   LEUKOCYTESUR LARGE (A) 09/06/2023 1112   Sepsis Labs: @LABRCNTIP (procalcitonin:4,lacticidven:4) ) Recent Results (from  the past 240 hours)  Resp panel by RT-PCR (RSV, Flu A&B, Covid) Anterior Nasal Swab     Status: None   Collection Time: 09/05/23  4:01 PM   Specimen: Anterior Nasal Swab  Result Value Ref Range Status   SARS Coronavirus 2 by RT PCR NEGATIVE NEGATIVE Final    Comment: (NOTE) SARS-CoV-2 target nucleic acids are NOT DETECTED.  The SARS-CoV-2 RNA is generally detectable in upper respiratory specimens during the acute phase of infection. The lowest concentration of SARS-CoV-2 viral copies this assay can detect is 138 copies/mL. A negative result does not preclude SARS-Cov-2 infection and should not be used as the sole basis for treatment or other patient management decisions. A negative result may occur with  improper specimen collection/handling, submission of specimen other than nasopharyngeal swab, presence of viral mutation(s) within the areas targeted by this assay, and inadequate number of viral copies(<138 copies/mL). A negative result must be combined with clinical observations, patient  history, and epidemiological information. The expected result is Negative.  Fact Sheet for Patients:  BloggerCourse.com  Fact Sheet for Healthcare Providers:  SeriousBroker.it  This test is no t yet approved or cleared by the [place] FDA and  has been authorized for detection and/or diagnosis of SARS-CoV-2 by FDA under an Emergency Use Authorization (EUA). This EUA will remain  in effect (meaning this test can be used) for the duration of the COVID-19 declaration under Section 564(b)(1) of the Act, 21 U.S.C.section 360bbb-3(b)(1), unless the authorization is terminated  or revoked sooner.       Influenza A by PCR NEGATIVE NEGATIVE Final   Influenza B by PCR NEGATIVE NEGATIVE Final    Comment: (NOTE) The Xpert Xpress SARS-CoV-2/FLU/RSV plus assay is intended as an aid in the diagnosis of influenza from Nasopharyngeal swab specimens and should not be used as a sole basis for treatment. Nasal washings and aspirates are unacceptable for Xpert Xpress SARS-CoV-2/FLU/RSV testing.  Fact Sheet for Patients: BloggerCourse.com  Fact Sheet for Healthcare Providers: SeriousBroker.it  This test is not yet approved or cleared by the [place] FDA and has been authorized for detection and/or diagnosis of SARS-CoV-2 by FDA under an Emergency Use Authorization (EUA). This EUA will remain in effect (meaning this test can be used) for the duration of the COVID-19 declaration under Section 564(b)(1) of the Act, 21 U.S.C. section 360bbb-3(b)(1), unless the authorization is terminated or revoked.     Resp Syncytial Virus by PCR NEGATIVE NEGATIVE Final    Comment: (NOTE) Fact Sheet for Patients: BloggerCourse.com  Fact Sheet for Healthcare Providers: SeriousBroker.it  This test is not yet approved or cleared by the [place] FDA  and has been authorized for detection and/or diagnosis of SARS-CoV-2 by FDA under an Emergency Use Authorization (EUA). This EUA will remain in effect (meaning this test can be used) for the duration of the COVID-19 declaration under Section 564(b)(1) of the Act, 21 U.S.C. section 360bbb-3(b)(1), unless the authorization is terminated or revoked.  Performed at [facility], [address] [place], [place] [address]   Blood Culture (routine x 2)     Status: None (Preliminary result)   Collection Time: 09/05/23  4:16 PM   Specimen: BLOOD  Result Value Ref Range Status   Specimen Description BLOOD RIGHT ANTECUBITAL  Final   Special Requests   Final    BOTTLES DRAWN AEROBIC AND ANAEROBIC Blood Culture adequate volume   Culture   Final    NO GROWTH 4 DAYS Performed at [facility], [address] [place], [place] [address]    Report Status PENDING  Incomplete  Blood Culture (routine x 2)     Status: None (Preliminary result)   Collection Time: 09/05/23  5:50 PM   Specimen: Left Antecubital; Blood  Result Value Ref Range Status   Specimen Description LEFT ANTECUBITAL  Final   Special Requests   Final    BOTTLES DRAWN AEROBIC ONLY Blood Culture results may not be optimal due to an inadequate volume of blood received in culture bottles   Culture   Final    NO GROWTH 4 DAYS Performed at [facility], [address], [place], [place] [address]    Report Status PENDING  Incomplete  MRSA Next Gen by PCR, Nasal     Status: None   Collection Time: 09/06/23  8:10 AM   Specimen: Nasal Mucosa; Nasal Swab  Result Value Ref Range Status   MRSA by PCR Next Gen NOT DETECTED NOT DETECTED Final    Comment: (NOTE) The GeneXpert MRSA Assay (FDA approved for NASAL specimens only), is one component of a comprehensive MRSA colonization surveillance program. It is not intended to diagnose MRSA infection nor to guide or monitor treatment for MRSA infections. Test performance is not FDA approved in  patients less than 66 years old. Performed at [facility], [address] [place], [place] [address]   Urine Culture     Status: Abnormal   Collection Time: 09/06/23 11:12 AM   Specimen: Urine, Clean Catch  Result Value Ref Range Status   Specimen Description   Final    URINE, CLEAN CATCH Performed at [facility], [address], [place], [place] [address]    Special Requests   Final    NONE Reflexed from [id number] Performed at [facility], [address], [place], [place] [address]    Culture (A)  Final    <10,000 COLONIES/mL INSIGNIFICANT GROWTH Performed at [facility], [address], [place], [place] [address]    Report Status 09/08/2023 FINAL  Final     Scheduled Meds:  apixaban  10 mg Oral BID   Followed by   Melene Muller ON 09/16/2023] apixaban  5 mg Oral BID   atorvastatin  80 mg Oral Daily   memantine  10 mg Oral BID   metoprolol succinate  12.5 mg Oral Daily   Continuous Infusions:  cefTRIAXone (ROCEPHIN)  IV 1 g (09/09/23 1057)    Procedures/Studies: ECHOCARDIOGRAM COMPLETE Result Date: 09/07/2023    ECHOCARDIOGRAM REPORT   Patient Name:   JAKAIYAH NUNNELEE Thrush Date of Exam: 09/07/2023 Medical Rec #:  [id number]            Height:       68.0 in Accession #:    [id number]           Weight:       167.1 lb Date of Birth:  Mar 18, 1941             BSA:          1.893 m Patient Age:    82 years             BP:           111/75 mmHg Patient Gender: F                    HR:           71 bpm. [facility] Procedure: 2D Echo, Color Doppler,  Cardiac Doppler and Intracardiac            Opacification Agent Indications:    R07.9* Chest pain, unspecified  History:        Patient has no prior history of Echocardiogram examinations.                 Risk Factors:Hypertension, Diabetes and Dyslipidemia.  Sonographer:    Irving Burton Senior RDCS Referring Phys: [id number] CHRISTOPHER P DANFORD IMPRESSIONS  1. Left ventricular ejection fraction, by estimation, is 30 to 35%. The left  ventricle has moderately decreased function. 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 There is mild left ventricular hypertrophy. Indeterminate diastolic filling due to E-A fusion.  2. Right ventricular systolic function is hyperdynamic. The right ventricular size is mildly enlarged.  3. The mitral valve is normal in structure. No evidence of mitral valve regurgitation. No evidence of mitral stenosis.  4. The aortic valve was not well visualized. Aortic valve regurgitation is not visualized. No aortic stenosis is present.  5. The inferior vena cava is normal in size but collapsibility could not be evaluated. Comparison(s): No prior Echocardiogram. FINDINGS  Left Ventricle: Left ventricular ejection fraction, by estimation, is 30 to 35%. The left ventricle has moderately decreased function. The left ventricle demonstrates regional wall motion abnormalities. The left ventricular internal cavity size was normal in size. There is mild left ventricular hypertrophy. Indeterminate diastolic filling due to E-A fusion.  LV Wall Scoring: The entire anterior wall, mid and distal anterior septum, entire apex, mid and distal inferior wall, mid anterolateral segment, and mid inferoseptal segment are akinetic. The basal anteroseptal segment, basal inferolateral segment, basal anterolateral segment, basal inferior segment, and basal inferoseptal segment are normal. Right Ventricle: The right ventricular size is mildly enlarged. No increase in right ventricular wall thickness. Right ventricular systolic function is hyperdynamic. Left Atrium: Left atrial size was normal in size. Right Atrium: Right atrial size was normal in size. Pericardium: There is no evidence of pericardial effusion. Mitral Valve: The mitral valve is normal in structure. No evidence of mitral valve regurgitation. No evidence of mitral valve stenosis. Tricuspid Valve: The  tricuspid valve is grossly normal. Tricuspid valve regurgitation is trivial. No evidence of tricuspid stenosis. Aortic Valve: The aortic valve was not well visualized. Aortic valve regurgitation is not visualized. No aortic stenosis is present. Pulmonic Valve: The pulmonic valve was not well visualized. Pulmonic valve regurgitation is not visualized. No evidence of pulmonic stenosis. Aorta: The aortic root and ascending aorta are structurally normal, with no evidence of dilitation. Venous: The inferior vena cava is normal in size but collapsibility could not be evaluated. IAS/Shunts: No atrial level shunt detected by color flow Doppler.  LEFT VENTRICLE PLAX 2D LVIDd:         3.38 cm LVIDs:         2.20 cm LV PW:         1.23 cm LV IVS:        1.32 cm LVOT diam:     1.80 cm LV SV:         41 LV SV Index:   22 LVOT Area:     2.54 cm  LV Volumes (MOD) LV vol d, MOD A2C: 87.5 ml LV vol d, MOD A4C: 88.1 ml LV vol s, MOD A2C: 51.9 ml LV vol s, MOD A4C: 65.4 ml LV SV MOD A2C:     35.7 ml LV SV MOD A4C:  88.1 ml LV SV MOD BP:      34.7 ml RIGHT VENTRICLE RV S prime:     12.60 cm/s TAPSE (M-mode): 1.9 cm LEFT ATRIUM           Index        RIGHT ATRIUM           Index LA diam:      2.70 cm 1.43 cm/m   RA Area:     12.30 cm LA Vol (A2C): 43.3 ml 22.87 ml/m  RA Volume:   24.90 ml  13.15 ml/m LA Vol (A4C): 26.9 ml 14.21 ml/m  AORTIC VALVE LVOT Vmax:   91.40 cm/s LVOT Vmean:  73.100 cm/s LVOT VTI:    0.161 m  AORTA Ao Root diam: 2.60 cm Ao Asc diam:  2.50 cm TRICUSPID VALVE TR Peak grad:   17.3 mmHg TR Vmax:        208.00 cm/s  SHUNTS Systemic VTI:  0.16 m Systemic Diam: 1.80 cm Vishnu Priya Mallipeddi Electronically signed by Winfield Rast Mallipeddi Signature Date/Time: 09/07/2023/2:05:30 PM    Final    CT ABDOMEN PELVIS W CONTRAST Result Date: 09/07/2023 CLINICAL DATA:  Pulmonary embolism (PE) suspected, high prob; Abdominal pain, acute, nonlocalized EXAM: CT ANGIOGRAPHY CHEST CT ABDOMEN AND PELVIS WITH CONTRAST  TECHNIQUE: Multidetector CT imaging of the chest was performed using the standard protocol during bolus administration of intravenous contrast. Multiplanar CT image reconstructions and MIPs were obtained to evaluate the vascular anatomy. Multidetector CT imaging of the abdomen and pelvis was performed using the standard protocol during bolus administration of intravenous contrast. RADIATION DOSE REDUCTION: This exam was performed according to the departmental dose-optimization program which includes automated exposure control, adjustment of the mA and/or kV according to patient size and/or use of iterative reconstruction technique. CONTRAST:  75mL OMNIPAQUE IOHEXOL 350 MG/ML SOLN; OMNIPAQUE IOHEXOL 300 MG/ML SOLN, 30mL OMNIPAQUE IOHEXOL 300 MG/ML SOLN COMPARISON:  09/05/2023 FINDINGS: CTA CHEST FINDINGS Cardiovascular: Satisfactory opacification of the pulmonary arteries. Filling defects within segmental and subsegmental branch pulmonary arteries within the right lower lobe. No central pulmonary embolism. No evidence of right heart strain. Heart size within normal limits. No significant pericardial effusion. Thoracic aorta is nonaneurysmal. Atherosclerotic calcifications of the aorta and coronary arteries. Mediastinum/Nodes: No enlarged mediastinal, hilar, or axillary lymph nodes. Thyroid gland, trachea, and esophagus demonstrate no significant findings. Tiny hiatal hernia. Lungs/Pleura: Increased dependent bibasilar subsegmental atelectasis. No pleural effusion or pneumothorax. Musculoskeletal: No chest wall abnormality. No acute or significant osseous findings. Degenerative spurring within the midthoracic spine. Review of the MIP images confirms the above findings. CT ABDOMEN and PELVIS FINDINGS Hepatobiliary: No focal liver abnormality is seen. No gallstones, gallbladder wall thickening, or biliary dilatation. Pancreas: Unremarkable. No pancreatic ductal dilatation or surrounding inflammatory changes.  Spleen: Normal in size without focal abnormality. Adrenals/Urinary Tract: Adrenal glands are unremarkable. Perinephric stranding anterior to the right kidney, increased from prior. Kidneys enhance symmetrically. Kidneys are otherwise normal, without renal calculi, solid lesion, or hydronephrosis. Bladder is unremarkable. Stomach/Bowel: Tiny hiatal hernia. Similar degree of abnormal thickening of the gastric cardia (series 2, image 20). Appendix not identified. Colonic diverticulosis. No evidence of bowel wall thickening, distention, or inflammatory changes. Vascular/Lymphatic: Aortic atherosclerosis. No enlarged abdominal or pelvic lymph nodes. Reproductive: Status post hysterectomy. No adnexal masses. Other: No free fluid. No abdominopelvic fluid collection. No pneumoperitoneum. No abdominal wall hernia. Musculoskeletal: No acute or significant osseous findings. Degenerative disc disease of L5-S1. Multilevel lumbar facet arthropathy. Review of the MIP  images confirms the above findings. IMPRESSION: 1. Examination positive for pulmonary embolism involving segmental and subsegmental branch pulmonary arteries within the right lower lobe. No central pulmonary embolism. No evidence of right heart strain. 2. Increased perinephric stranding anterior to the right kidney, which may be secondary to urinary tract infection. 3. Similar degree of abnormal thickening of the gastric cardia. Underlying mass not excluded. Correlate with endoscopy. 4. Colonic diverticulosis without evidence of acute diverticulitis. 5. Aortic and coronary artery atherosclerosis (ICD10-I70.0). These results will be called to the ordering clinician or representative by the Radiologist Assistant, and communication documented in the PACS or [employer]. Electronically Signed   By: Duanne Guess D.O.   On: 09/07/2023 11:33   CT Angio Chest Pulmonary Embolism (PE) W or WO Contrast Result Date: 09/07/2023 CLINICAL DATA:  Pulmonary embolism (PE)  suspected, high prob; Abdominal pain, acute, nonlocalized EXAM: CT ANGIOGRAPHY CHEST CT ABDOMEN AND PELVIS WITH CONTRAST TECHNIQUE: Multidetector CT imaging of the chest was performed using the standard protocol during bolus administration of intravenous contrast. Multiplanar CT image reconstructions and MIPs were obtained to evaluate the vascular anatomy. Multidetector CT imaging of the abdomen and pelvis was performed using the standard protocol during bolus administration of intravenous contrast. RADIATION DOSE REDUCTION: This exam was performed according to the departmental dose-optimization program which includes automated exposure control, adjustment of the mA and/or kV according to patient size and/or use of iterative reconstruction technique. CONTRAST:  75mL OMNIPAQUE IOHEXOL 350 MG/ML SOLN; OMNIPAQUE IOHEXOL 300 MG/ML SOLN, 30mL OMNIPAQUE IOHEXOL 300 MG/ML SOLN COMPARISON:  09/05/2023 FINDINGS: CTA CHEST FINDINGS Cardiovascular: Satisfactory opacification of the pulmonary arteries. Filling defects within segmental and subsegmental branch pulmonary arteries within the right lower lobe. No central pulmonary embolism. No evidence of right heart strain. Heart size within normal limits. No significant pericardial effusion. Thoracic aorta is nonaneurysmal. Atherosclerotic calcifications of the aorta and coronary arteries. Mediastinum/Nodes: No enlarged mediastinal, hilar, or axillary lymph nodes. Thyroid gland, trachea, and esophagus demonstrate no significant findings. Tiny hiatal hernia. Lungs/Pleura: Increased dependent bibasilar subsegmental atelectasis. No pleural effusion or pneumothorax. Musculoskeletal: No chest wall abnormality. No acute or significant osseous findings. Degenerative spurring within the midthoracic spine. Review of the MIP images confirms the above findings. CT ABDOMEN and PELVIS FINDINGS Hepatobiliary: No focal liver abnormality is seen. No gallstones, gallbladder wall thickening,  or biliary dilatation. Pancreas: Unremarkable. No pancreatic ductal dilatation or surrounding inflammatory changes. Spleen: Normal in size without focal abnormality. Adrenals/Urinary Tract: Adrenal glands are unremarkable. Perinephric stranding anterior to the right kidney, increased from prior. Kidneys enhance symmetrically. Kidneys are otherwise normal, without renal calculi, solid lesion, or hydronephrosis. Bladder is unremarkable. Stomach/Bowel: Tiny hiatal hernia. Similar degree of abnormal thickening of the gastric cardia (series 2, image 20). Appendix not identified. Colonic diverticulosis. No evidence of bowel wall thickening, distention, or inflammatory changes. Vascular/Lymphatic: Aortic atherosclerosis. No enlarged abdominal or pelvic lymph nodes. Reproductive: Status post hysterectomy. No adnexal masses. Other: No free fluid. No abdominopelvic fluid collection. No pneumoperitoneum. No abdominal wall hernia. Musculoskeletal: No acute or significant osseous findings. Degenerative disc disease of L5-S1. Multilevel lumbar facet arthropathy. Review of the MIP images confirms the above findings. IMPRESSION: 1. Examination positive for pulmonary embolism involving segmental and subsegmental branch pulmonary arteries within the right lower lobe. No central pulmonary embolism. No evidence of right heart strain. 2. Increased perinephric stranding anterior to the right kidney, which may be secondary to urinary tract infection. 3. Similar degree of abnormal thickening of the gastric cardia. Underlying mass  not excluded. Correlate with endoscopy. 4. Colonic diverticulosis without evidence of acute diverticulitis. 5. Aortic and coronary artery atherosclerosis (ICD10-I70.0). These results will be called to the ordering clinician or representative by the Radiologist Assistant, and communication documented in the PACS or [employer]. Electronically Signed   By: Duanne Guess D.O.   On: 09/07/2023 11:33   US  Venous Img Lower Bilateral (DVT) Result Date: 09/06/2023 CLINICAL DATA:  BILATERAL lower extremity edema 9965.  Pain. EXAM: BILATERAL LOWER EXTREMITY VENOUS DOPPLER ULTRASOUND TECHNIQUE: Gray-scale sonography with graded compression, as well as color Doppler and duplex ultrasound were performed to evaluate the lower extremity deep venous systems from the level of the common femoral vein and including the common femoral, femoral, profunda femoral, popliteal and calf veins including the posterior tibial, peroneal and gastrocnemius veins when visible. The superficial great saphenous vein was also interrogated. Spectral Doppler was utilized to evaluate flow at rest and with distal augmentation maneuvers in the common femoral, femoral and popliteal veins. COMPARISON:  CT CAP, 09/05/2023. FINDINGS: RIGHT LOWER EXTREMITY VENOUS Normal compressibility of the RIGHT common femoral vein. Incomplete evaluation at the superficial femoral, and popliteal veins, as well as the calf veins secondary to patient discomfort with compression. Limitations: Patient could not tolerate compressions LEFT LOWER EXTREMITY VENOUS Normal compressibility of the LEFT common femoral, superficial femoral, and popliteal veins, as well as the visualized calf veins. Visualized portions of profunda femoral vein and great saphenous vein unremarkable. No filling defects to suggest DVT on grayscale or color Doppler imaging. Doppler waveforms show normal direction of venous flow, normal respiratory plasticity and response to augmentation. OTHER No evidence of superficial thrombophlebitis or abnormal fluid collection. Limitations: none IMPRESSION: Suboptimal evaluation, within these constraints; 1. No evidence of femoropopliteal DVT or superficial thrombophlebitis within the LEFT lower extremity. 2. Incomplete assessment of the RIGHT lower extremity secondary to patient discomfort. Roanna Banning, MD [facility]  Radiology Electronically Signed   By: Roanna Banning M.D.   On: 09/06/2023 09:57   CT CHEST ABDOMEN PELVIS WO CONTRAST Result Date: 09/05/2023 CLINICAL DATA:  Sepsis, dementia EXAM: CT CHEST, ABDOMEN AND PELVIS WITHOUT CONTRAST TECHNIQUE: Multidetector CT imaging of the chest, abdomen and pelvis was performed following the standard protocol without IV contrast. RADIATION DOSE REDUCTION: This exam was performed according to the departmental dose-optimization program which includes automated exposure control, adjustment of the mA and/or kV according to patient size and/or use of iterative reconstruction technique. COMPARISON:  None Available. FINDINGS: CT CHEST FINDINGS Cardiovascular: The heart is normal in size. No pericardial effusion. No evidence of thoracic aortic aneurysm. Atherosclerotic calcifications of the aortic arch. Severe three-coronary atherosclerosis. Mediastinum/Nodes: No suspicious mediastinal lymphadenopathy. Subcentimeter calcified left thyroid nodules, benign. No follow-up is recommended. Lungs/Pleura: Mild subpleural ground-glass opacity with central low-density at the lateral right lung base (series 4/image 108). This appearance is nonspecific but at least raises the possibility of an early pulmonary infarct. Mild bibasilar atelectasis/scarring. No suspicious pulmonary nodules. No pleural effusion or pneumothorax. Musculoskeletal: Degenerative changes of the mid thoracic spine. CT ABDOMEN PELVIS FINDINGS Hepatobiliary: Unenhanced liver is unremarkable. Gallbladder is unremarkable. No intrahepatic or extrahepatic duct dilatation. Pancreas: Within normal limits. Spleen: Within normal limits but Adrenals/Urinary Tract: Adrenal glands are within normal limits. Left kidney is within normal limits. Right renal cysts measuring up to 2.0 cm (series 3/image 69), benign (Bosniak I). No follow-up is recommended. No renal, ureteral, or bladder calculi.  No hydronephrosis. Bladder is within normal limits.  Stomach/Bowel: Stomach is  notable for a tiny hiatal hernia. Soft tissue prominence at the gastric cardia (series 3/image 51), poorly evaluated on unenhanced CT, but at least raising the possibility of a mass. No evidence of bowel obstruction. Appendix is not discretely visualized. Extensive sigmoid diverticulosis, without evidence of diverticulitis. Vascular/Lymphatic: No evidence of abdominal aortic aneurysm. Atherosclerotic calcifications of the abdominal aorta and branch vessels. No suspicious abdominopelvic lymphadenopathy. Reproductive: Status post hysterectomy. No adnexal masses. Other: No abdominopelvic ascites. Musculoskeletal: Mild degenerative changes at L5-S1. IMPRESSION: Focal ground-glass opacity at the right lung base, nonspecific. However, early pulmonary infarct could have this appearance. Consider PE protocol CT for further evaluation, as clinically warranted. Soft tissue prominence at the gastric cardia, poorly evaluated on unenhanced CT, but at least raising the possibility of a mass. Consider CT abdomen with contrast for further evaluation. Extensive sigmoid diverticulosis, without evidence of diverticulitis. Aortic Atherosclerosis (ICD10-I70.0). Electronically Signed   By: Charline Bills M.D.   On: 09/05/2023 20:56   CT Head Wo Contrast Result Date: 09/05/2023 CLINICAL DATA:  Altered mental status. EXAM: CT HEAD WITHOUT CONTRAST TECHNIQUE: Contiguous axial images were obtained from the base of the skull through the vertex without intravenous contrast. RADIATION DOSE REDUCTION: This exam was performed according to the departmental dose-optimization program which includes automated exposure control, adjustment of the mA and/or kV according to patient size and/or use of iterative reconstruction technique. COMPARISON:  None Available. FINDINGS: Brain: Mild age-related atrophy and chronic microvascular ischemic changes. There is no acute intracranial hemorrhage. No mass effect or midline shift.  No extra-axial fluid collection. Vascular: No hyperdense vessel or unexpected calcification. Skull: Normal. Negative for fracture or focal lesion. Sinuses/Orbits: No acute finding. Other: None IMPRESSION: 1. No acute intracranial pathology. 2. Mild age-related atrophy and chronic microvascular ischemic changes. Electronically Signed   By: Elgie Collard M.D.   On: 09/05/2023 20:45   DG Chest Port 1 View Result Date: 09/05/2023 CLINICAL DATA:  Questionable sepsis - evaluate for abnormality EXAM: PORTABLE CHEST 1 VIEW COMPARISON:  None Available. FINDINGS: Normal heart size. Normal mediastinal contour. No pneumothorax. No pleural effusion. Lungs appear clear, with no acute consolidative airspace disease and no pulmonary edema. IMPRESSION: No active disease. Electronically Signed   By: Delbert Phenix M.D.   On: 09/05/2023 17:44    Catarina Hartshorn, DO  [facility]  If 7PM-7AM, please contact night-coverage [email or web address] Password [email or web address] 09/09/2023, 4:06 PM   LOS: 3 days

## 2023-09-09 NOTE — Plan of Care (Signed)
  Problem: Acute Rehab OT Goals (only OT should resolve) Goal: Pt. Will Perform Grooming Flowsheets (Taken 09/09/2023 1424) Pt Will Perform Grooming: with set-up Goal: Pt. Will Perform Lower Body Bathing Flowsheets (Taken 09/09/2023 1424) Pt Will Perform Lower Body Bathing:  with min assist  sitting/lateral leans Goal: Pt. Will Perform Upper Body Dressing Flowsheets (Taken 09/09/2023 1424) Pt Will Perform Upper Body Dressing: with min assist Goal: Pt. Will Transfer To Toilet Flowsheets (Taken 09/09/2023 1424) Pt Will Transfer to Toilet: with min assist  Lurena Joiner, OTR/L

## 2023-09-09 NOTE — Evaluation (Signed)
Occupational Therapy Evaluation Patient Details Name: Michelle Mcclain MRN: [id number] DOB: 1941/05/04 Today's Date: 09/09/2023   History of Present Illness Michelle Mcclain is a 82 y.o. female with medical history significant for hypertension, hyperlipidemia, type 2 diabetes, diabetic polyneuropathy, who presented to the ED due to altered mental status.  Reportedly the patient has been laying in bed for the past 3 days soaked in urine.  She would not allow family members to change her due to pain in her legs.  EMS was activated.   Clinical Impression   Pt agreeable to OT evaluation. Pt was in char upon arrival. Pt reports that she would like to get back into bed. Pt required mod assist for sit to stand t/f. She was able to take steps forwards, backwards, and to the side. She maintained balance with RW in standin with min assist. Pt requires mod/max assist for ADLs at this time. Pt would continue to benefit form OT services while in acute setting.        If plan is discharge home, recommend the following: A lot of help with walking and/or transfers;A lot of help with bathing/dressing/bathroom    Functional Status Assessment  Patient has had a recent decline in their functional status and demonstrates the ability to make significant improvements in function in a reasonable and predictable amount of time.  Equipment Recommendations  None recommended by OT    Recommendations for Other Services       Precautions / Restrictions Precautions Precautions: Fall Restrictions Weight Bearing Restrictions Per Provider Order: No      Mobility Bed Mobility Overal bed mobility: Needs Assistance Bed Mobility: Supine to Sit     Supine to sit: Mod assist          Transfers Overall transfer level: Needs assistance Equipment used: Rolling walker (2 wheels) Transfers: Sit to/from Stand, Bed to chair/wheelchair/BSC Sit to Stand: Mod assist     Step pivot transfers: Mod assist             Balance Overall balance assessment: Needs assistance Sitting-balance support: Feet supported, Bilateral upper extremity supported Sitting balance-Leahy Scale: Fair Sitting balance - Comments: fair sitting balance on edge of bed   Standing balance support: Bilateral upper extremity supported, During functional activity, Reliant on assistive device for balance Standing balance-Leahy Scale: Fair Standing balance comment: fair balance reliant on RW                           ADL either performed or assessed with clinical judgement   ADL Overall ADL's : Needs assistance/impaired Eating/Feeding: Set up   Grooming: Set up;Minimal assistance   Upper Body Bathing: Moderate assistance   Lower Body Bathing: Maximal assistance   Upper Body Dressing : Moderate assistance   Lower Body Dressing: Maximal assistance   Toilet Transfer: Moderate assistance   Toileting- Clothing Manipulation and Hygiene: Maximal assistance   Tub/ Shower Transfer: Moderate assistance   Functional mobility during ADLs: Moderate assistance       Vision Baseline Vision/History: 0 No visual deficits Ability to See in Adequate Light: 0 Adequate Patient Visual Report: No change from baseline              Pertinent Vitals/Pain Pain Assessment Pain Assessment: Faces Faces Pain Scale: Hurts even more Pain Location: initially reports no pain but groans with pain with supine to sit Pain Descriptors / Indicators: Aching, Grimacing Pain Intervention(s): Limited activity within patient's tolerance  Extremity/Trunk Assessment Upper Extremity Assessment Upper Extremity Assessment: Generalized weakness   Lower Extremity Assessment Lower Extremity Assessment: Defer to PT evaluation       Communication Communication Communication: No apparent difficulties   Cognition Arousal: Alert Behavior During Therapy: WFL for tasks assessed/performed Overall Cognitive Status: Within Functional  Limits for tasks assessed                                                  Home Living Family/patient expects to be discharged to:: Private residence Living Arrangements: Spouse/significant other Available Help at Discharge: Family Type of Home: House       Home Layout: One level         Bathroom Toilet: Standard Bathroom Accessibility: No   Home Equipment: Agricultural consultant (2 wheels);Cane - single point          Prior Functioning/Environment Prior Level of Function : Independent/Modified Independent             Mobility Comments: Just recently got a walker, has not even used yet per daughter ADLs Comments: assisted        OT Problem List: Decreased activity tolerance;Decreased strength      OT Treatment/Interventions:      OT Goals(Current goals can be found in the care plan section) Acute Rehab OT Goals Patient Stated Goal: to go to rehab OT Goal Formulation: With patient Time For Goal Achievement: 09/23/23 Potential to Achieve Goals: Fair ADL Goals Pt Will Perform Grooming: with set-up Pt Will Perform Lower Body Bathing: with min assist;sitting/lateral leans Pt Will Perform Upper Body Dressing: with min assist Pt Will Transfer to Toilet: with min assist  OT Frequency:         AM-PAC OT "6 Clicks" Daily Activity     Outcome Measure Help from another person eating meals?: A Little Help from another person taking care of personal grooming?: A Little Help from another person toileting, which includes using toliet, bedpan, or urinal?: Total Help from another person bathing (including washing, rinsing, drying)?: A Lot Help from another person to put on and taking off regular upper body clothing?: A Lot Help from another person to put on and taking off regular lower body clothing?: A Lot 6 Click Score: 13   End of Session    Activity Tolerance:  tolerated treatment well  Patient left:  in bed with call ball in reach and nurse present                     Time: 1330-1405 OT Time Calculation (min): 35 min Charges:  OT General Charges $OT Visit: 1 Visit OT Evaluation $OT Eval Low Complexity: 1 Low OT Treatments $Self Care/Home Management : 8-22 mins  Bevelyn Ngo, OTR/L  09/09/2023, 2:25 PM

## 2023-09-09 NOTE — TOC Progression Note (Signed)
Transition of Care [facility]) - Progression Note    Patient Details  Name: [name] MRN: [id number] Date of Birth: 08/06/1941  [facility] CM/SW Contact  Elliot Gault, LCSW Phone Number: 09/09/2023, 12:27 PM  Clinical Narrative:     TOC following. Reviewed SNF bed offers with pt's daughter. Family selects [facility]. Will update insurance auth request.  MD anticipating dc tomorrow. [facility] at [facility] states that they can accept pt tomorrow if she has insurance auth.  TOC will follow.  Expected Discharge Plan: Home w Home Health Services Barriers to Discharge: Continued Medical Work up  Expected Discharge Plan and Services In-house Referral: Clinical Social Work     Living arrangements for the past 2 months: Single Family Home                                       Social Determinants of Health (SDOH) Interventions SDOH Screenings   Food Insecurity: No Food Insecurity (09/06/2023)  Housing: Low Risk  (09/06/2023)  Transportation Needs: No Transportation Needs (09/06/2023)  Utilities: Not At Risk (09/06/2023)  Tobacco Use: Unknown (09/05/2023)    Readmission Risk Interventions     No data to display

## 2023-09-10 DIAGNOSIS — E876 Hypokalemia: Secondary | ICD-10-CM | POA: Diagnosis not present

## 2023-09-10 DIAGNOSIS — A419 Sepsis, unspecified organism: Secondary | ICD-10-CM | POA: Diagnosis not present

## 2023-09-10 DIAGNOSIS — I2699 Other pulmonary embolism without acute cor pulmonale: Secondary | ICD-10-CM | POA: Diagnosis not present

## 2023-09-10 LAB — CULTURE, BLOOD (ROUTINE X 2)
Culture: NO GROWTH
Culture: NO GROWTH
Special Requests: ADEQUATE

## 2023-09-10 LAB — BASIC METABOLIC PANEL
Anion gap: 12 (ref 5–15)
BUN: 13 mg/dL (ref 8–23)
CO2: 21 mmol/L — ABNORMAL LOW (ref 22–32)
Calcium: 8.8 mg/dL — ABNORMAL LOW (ref 8.9–10.3)
Chloride: 105 mmol/L (ref 98–111)
Creatinine, Ser: 0.77 mg/dL (ref 0.44–1.00)
GFR, Estimated: >60 mL/min (ref >60)
Glucose, Bld: 96 mg/dL (ref 70–99)
Potassium: 3.3 mmol/L — ABNORMAL LOW (ref 3.5–5.1)
Sodium: 138 mmol/L (ref 135–145)

## 2023-09-10 LAB — MAGNESIUM: Magnesium: 1.6 mg/dL — ABNORMAL LOW (ref 1.7–2.4)

## 2023-09-10 LAB — GLUCOSE, CAPILLARY
Glucose-Capillary: 93 mg/dL (ref 70–99)
Glucose-Capillary: 137 mg/dL — ABNORMAL HIGH (ref 70–99)

## 2023-09-10 MED ORDER — CEFDINIR 300 MG PO CAPS: 300.0000 mg | ORAL_CAPSULE | Freq: Two times a day (BID) | ORAL | Status: AC

## 2023-09-10 MED ORDER — POTASSIUM CHLORIDE CRYS ER 20 MEQ PO TBCR
40.0000 meq | EXTENDED_RELEASE_TABLET | Freq: Once | ORAL | Status: AC
  Administered 2023-09-10: 40 meq via ORAL
  Filled 2023-09-10: qty 2

## 2023-09-10 MED ORDER — MAGNESIUM OXIDE -MG SUPPLEMENT 400 (240 MG) MG PO TABS: 400.0000 mg | ORAL_TABLET | Freq: Every day | ORAL | Status: DC

## 2023-09-10 MED ORDER — CEFDINIR 300 MG PO CAPS: 300.0000 mg | ORAL_CAPSULE | Freq: Two times a day (BID) | ORAL | Status: DC

## 2023-09-10 MED ORDER — APIXABAN 5 MG PO TABS: ORAL_TABLET | ORAL | Status: AC

## 2023-09-10 MED ORDER — MAGNESIUM SULFATE 2 GM/50ML IV SOLN
2.0000 g | Freq: Once | INTRAVENOUS | Status: AC
  Administered 2023-09-10: 2 g via INTRAVENOUS
  Filled 2023-09-10: qty 50

## 2023-09-10 MED ORDER — MELATONIN 3 MG PO TABS: 6.0000 mg | ORAL_TABLET | Freq: Every evening | ORAL | Status: AC | PRN

## 2023-09-10 MED ORDER — MAGNESIUM OXIDE -MG SUPPLEMENT 400 (240 MG) MG PO TABS: 400.0000 mg | ORAL_TABLET | Freq: Every day | ORAL | Status: AC

## 2023-09-10 NOTE — Progress Notes (Signed)
Called report to [place] at [facility].  All questions answered. Erick Blinks, RN

## 2023-09-10 NOTE — Discharge Summary (Signed)
Physician Discharge Summary   Patient: Michelle Mcclain MRN: [id number] DOB: 28-Aug-1941  Admit date:     09/05/2023  Discharge date: 09/10/23  Discharge Physician: Onalee Hua Dellis Voght   PCP: Butch Penny, MD (Inactive)   Recommendations at discharge:   Please follow up with primary care provider within 1-2 weeks  Please repeat BMP and CBC in one week    Hospital Course: 82 y.o. F with DM2, HTN, HLD and dementia, and diabetic polyneuropathy  presented with change in mentation, generalized weakness.  Reportedly the patient has been laying in bed for the past 3 days soaked in urine. She would not allow family members to change her due to pain in her legs. EMS was activated.  09/05/2023 urinalysis showed 21-50 WBC.  Urine culture was not initially done.  The patient was started on ceftriaxone on 09/05/2023 at 1807.  Urine culture was ultimately obtained on 09/06/2023 and showed <10K growth. Patient was started on intravenous ceftriaxone 09/05/23 at 1807.   12/11: New tachcyardia, troponin up 671>> 554, Cardiology consulted; CTA showed PE; Echo EF 30-35%,, RWMA Patient was started on IV heparin.  On 09/09/2023, the patient was transitioned to apixaban.  Decision was made to treat the patient medically without any invasive procedures.  Assessment and Plan: Sepsis secondary to UTI [facility]) -Presented with AMS, weakness, leukocytosis, tachcycardia, and pyruria.  -CT showed pyelonephritis of right kidney, UA consistent with UTI. Urine culture insignificant growth.  Blood cultures no growth. - Continued Rocephin during hospitalization -- d/c with cefdinir x 4 more days to complete 10 days therapy   Acute pulmonary embolism (HCC) Remains asymptomatic and hemodynamically stable. -Continue heparin infusion - Transition to DOAC 12/13 evening -apixaban 10 mg bid through 09/15/23, then 5 mg bid starting 09/16/23--2000 dose   New Cardiomyopathy [facility]) Demand ischemia There are some uncertainty if her  cardiomyopathy and regional wall motion abnormalities are for LAD plaque, or reactive to the PE, but favor the latter --Cardiology consulted, recommend against invasive strategy, we will plan for medical management alone for now. No clear heart failure symptoms at present - Continue Lipitor -soft BP limits GDMT -continue metoprolol succinate -repeat echo in 1-2 months -received 48 hours IV heparin -re-eval as outpt for ARB, ARNI   Hypokalemia - Supplement K    Pressure injury of skin Buttocks, medial stage II, present on admission - Daily wound care   Dementia without behavioral disturbance (HCC) - Continue home memantine   Acute metabolic encephalopathy Delirium She remains intermittently confused - Hold Lyrica - Continue memantine - Standard delirium precautions: blinds open and lights on during day, TV off, minimize interruptions at night, glasses/hearing aids, PT/OT, avoiding Beers list medications   -- overall mental status improved near baseline at time of d/c   Controlled type 2 diabetes mellitus without complication, without long-term current use of insulin (HCC) -Glucose controlled -12/9 A1C 5.7 - Continue sliding scale corrections - Hold home metformin>>restart after d/c       Mixed hyperlipidemia -Continue Lipitor   Essential hypertension BP normal off meds - Hold hydrochlorothiazide and losartan and metoprolol for now   AKI (acute kidney injury) (HCC) Resolved -baseline creatinine 0.8-0.9 -serum creatinine peaked 1.36   Hypercalcemia Resolved -due to HCTZ   Hypernatremia Resolved  Hypomagnesemia -replete   Abnormal CT of the abdomen (gastric cardia thickening) In the setting of her functional decline over the last 6 months, poor p.o. intake, and new PE, this is obviously concerning for malignancy.  Given her delirium I do not  think an invasive strategy at this time is appropriate.  Family will discuss among some cells and share with me whether  they would prefer workup in the future.           Consultants: cardiology Procedures performed: none  Disposition: Skilled nursing facility Diet recommendation:  Cardiac diet DISCHARGE MEDICATION: Allergies as of 09/10/2023   No Known Allergies      Medication List     STOP taking these medications    hydrochlorothiazide 50 MG tablet Commonly known as: HYDRODIURIL   losartan 100 MG tablet Commonly known as: COZAAR   pregabalin 75 MG capsule Commonly known as: LYRICA       TAKE these medications    apixaban 5 MG Tabs tablet Commonly known as: ELIQUIS Take 2 tablets (10 mg total) by mouth 2 (two) times daily for 6 days, THEN 1 tablet (5 mg total) 2 (two) times daily. Start taking on: September 10, 2023   atorvastatin 80 MG tablet Commonly known as: LIPITOR Take 80 mg by mouth daily.   cefdinir 300 MG capsule Commonly known as: OMNICEF Take 1 capsule (300 mg total) by mouth every 12 (twelve) hours. X 4 days Start taking on: September 11, 2023   magnesium oxide 400 (240 Mg) MG tablet Commonly known as: MAG-OX Take 1 tablet (400 mg total) by mouth daily. Start taking on: September 11, 2023   melatonin 3 MG Tabs tablet Take 2 tablets (6 mg total) by mouth at bedtime as needed.   memantine 10 MG tablet Commonly known as: NAMENDA Take 10 mg by mouth 2 (two) times daily.   metFORMIN 1000 MG tablet Commonly known as: GLUCOPHAGE Take 1,000 mg by mouth 2 (two) times daily.   metoprolol succinate 25 MG 24 hr tablet Commonly known as: TOPROL-XL Take 25 mg by mouth daily.   multivitamins ther. w/minerals Tabs tablet Take 1 tablet by mouth daily.        Contact information for follow-up providers     Dyann Kief, PA-C Follow up.   Specialty: Cardiology Why: [facility]iology Hospital Follow-up on 10/04/2023 at 12:30 PM. Contact information: [address] [place] [address] [phone]              Contact information for after-discharge care      Destination     [facility] .   Service: Skilled Nursing Contact information: [address] [place] [address] [phone]                    Discharge Exam: Ceasar Mons Weights   09/05/23 1541 09/06/23 0806  Weight: 97 kg 75.8 kg   HEENT:  [place]/AT, No thrush, no icterus CV:  RRR, no rub, no S3, no S4 Lung:  CTA, no wheeze, no rhonchi Abd:  soft/+BS, NT Ext:  No edema, no lymphangitis, no synovitis, no rash   Condition at discharge: stable  The results of significant diagnostics from this hospitalization (including imaging, microbiology, ancillary and laboratory) are listed below for reference.   Imaging Studies: ECHOCARDIOGRAM COMPLETE Result Date: 09/07/2023    ECHOCARDIOGRAM REPORT   Patient Name:   COSBY PISCIOTTA Epting Date of Exam: 09/07/2023 Medical Rec #:  [id number]            Height:       68.0 in Accession #:    [id number]           Weight:       167.1 lb Date of  Birth:  [birth year]             BSA:          1.893 m Patient Age:    82 years             BP:           111/75 mmHg Patient Gender: F                    HR:           71 bpm. [facility] Procedure: 2D Echo, Color Doppler, Cardiac Doppler and Intracardiac            Opacification Agent Indications:    R07.9* Chest pain, unspecified  History:        Patient has no prior history of Echocardiogram examinations.                 Risk Factors:Hypertension, Diabetes and Dyslipidemia.  Sonographer:    Irving Burton Senior RDCS Referring Phys: [id number] CHRISTOPHER P DANFORD IMPRESSIONS  1. Left ventricular ejection fraction, by estimation, is 30 to 35%. The left ventricle has moderately decreased function. 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 There is mild left ventricular hypertrophy. Indeterminate diastolic filling due to E-A fusion.  2. Right ventricular systolic function is  hyperdynamic. The right ventricular size is mildly enlarged.  3. The mitral valve is normal in structure. No evidence of mitral valve regurgitation. No evidence of mitral stenosis.  4. The aortic valve was not well visualized. Aortic valve regurgitation is not visualized. No aortic stenosis is present.  5. The inferior vena cava is normal in size but collapsibility could not be evaluated. Comparison(s): No prior Echocardiogram. FINDINGS  Left Ventricle: Left ventricular ejection fraction, by estimation, is 30 to 35%. The left ventricle has moderately decreased function. The left ventricle demonstrates regional wall motion abnormalities. The left ventricular internal cavity size was normal in size. There is mild left ventricular hypertrophy. Indeterminate diastolic filling due to E-A fusion.  LV Wall Scoring: The entire anterior wall, mid and distal anterior septum, entire apex, mid and distal inferior wall, mid anterolateral segment, and mid inferoseptal segment are akinetic. The basal anteroseptal segment, basal inferolateral segment, basal anterolateral segment, basal inferior segment, and basal inferoseptal segment are normal. Right Ventricle: The right ventricular size is mildly enlarged. No increase in right ventricular wall thickness. Right ventricular systolic function is hyperdynamic. Left Atrium: Left atrial size was normal in size. Right Atrium: Right atrial size was normal in size. Pericardium: There is no evidence of pericardial effusion. Mitral Valve: The mitral valve is normal in structure. No evidence of mitral valve regurgitation. No evidence of mitral valve stenosis. Tricuspid Valve: The tricuspid valve is grossly normal. Tricuspid valve regurgitation is trivial. No evidence of tricuspid stenosis. Aortic Valve: The aortic valve was not well visualized. Aortic valve regurgitation is not visualized. No aortic stenosis is present. Pulmonic Valve: The pulmonic valve was not well visualized. Pulmonic  valve regurgitation is not visualized. No evidence of pulmonic stenosis. Aorta: The aortic root and ascending aorta are structurally normal, with no evidence of dilitation. Venous: The inferior vena cava is normal in size but collapsibility could not be evaluated. IAS/Shunts: No atrial level shunt detected by color flow Doppler.  LEFT VENTRICLE PLAX 2D LVIDd:         3.38 cm LVIDs:         2.20 cm  LV PW:         1.23 cm LV IVS:        1.32 cm LVOT diam:     1.80 cm LV SV:         41 LV SV Index:   22 LVOT Area:     2.54 cm  LV Volumes (MOD) LV vol d, MOD A2C: 87.5 ml LV vol d, MOD A4C: 88.1 ml LV vol s, MOD A2C: 51.9 ml LV vol s, MOD A4C: 65.4 ml LV SV MOD A2C:     35.7 ml LV SV MOD A4C:     88.1 ml LV SV MOD BP:      34.7 ml RIGHT VENTRICLE RV S prime:     12.60 cm/s TAPSE (M-mode): 1.9 cm LEFT ATRIUM           Index        RIGHT ATRIUM           Index LA diam:      2.70 cm 1.43 cm/m   RA Area:     12.30 cm LA Vol (A2C): 43.3 ml 22.87 ml/m  RA Volume:   24.90 ml  13.15 ml/m LA Vol (A4C): 26.9 ml 14.21 ml/m  AORTIC VALVE LVOT Vmax:   91.40 cm/s LVOT Vmean:  73.100 cm/s LVOT VTI:    0.161 m  AORTA Ao Root diam: 2.60 cm Ao Asc diam:  2.50 cm TRICUSPID VALVE TR Peak grad:   17.3 mmHg TR Vmax:        208.00 cm/s  SHUNTS Systemic VTI:  0.16 m Systemic Diam: 1.80 cm Vishnu Priya Mallipeddi Electronically signed by Winfield Rast Mallipeddi Signature Date/Time: 09/07/2023/2:05:30 PM    Final    CT ABDOMEN PELVIS W CONTRAST Result Date: 09/07/2023 CLINICAL DATA:  Pulmonary embolism (PE) suspected, high prob; Abdominal pain, acute, nonlocalized EXAM: CT ANGIOGRAPHY CHEST CT ABDOMEN AND PELVIS WITH CONTRAST TECHNIQUE: Multidetector CT imaging of the chest was performed using the standard protocol during bolus administration of intravenous contrast. Multiplanar CT image reconstructions and MIPs were obtained to evaluate the vascular anatomy. Multidetector CT imaging of the abdomen and pelvis was performed using the  standard protocol during bolus administration of intravenous contrast. RADIATION DOSE REDUCTION: This exam was performed according to the departmental dose-optimization program which includes automated exposure control, adjustment of the mA and/or kV according to patient size and/or use of iterative reconstruction technique. CONTRAST:  75mL OMNIPAQUE IOHEXOL 350 MG/ML SOLN; OMNIPAQUE IOHEXOL 300 MG/ML SOLN, 30mL OMNIPAQUE IOHEXOL 300 MG/ML SOLN COMPARISON:  09/05/2023 FINDINGS: CTA CHEST FINDINGS Cardiovascular: Satisfactory opacification of the pulmonary arteries. Filling defects within segmental and subsegmental branch pulmonary arteries within the right lower lobe. No central pulmonary embolism. No evidence of right heart strain. Heart size within normal limits. No significant pericardial effusion. Thoracic aorta is nonaneurysmal. Atherosclerotic calcifications of the aorta and coronary arteries. Mediastinum/Nodes: No enlarged mediastinal, hilar, or axillary lymph nodes. Thyroid gland, trachea, and esophagus demonstrate no significant findings. Tiny hiatal hernia. Lungs/Pleura: Increased dependent bibasilar subsegmental atelectasis. No pleural effusion or pneumothorax. Musculoskeletal: No chest wall abnormality. No acute or significant osseous findings. Degenerative spurring within the midthoracic spine. Review of the MIP images confirms the above findings. CT ABDOMEN and PELVIS FINDINGS Hepatobiliary: No focal liver abnormality is seen. No gallstones, gallbladder wall thickening, or biliary dilatation. Pancreas: Unremarkable. No pancreatic ductal dilatation or surrounding inflammatory changes. Spleen: Normal in size without focal abnormality. Adrenals/Urinary Tract: Adrenal glands are unremarkable. Perinephric stranding anterior to the  right kidney, increased from prior. Kidneys enhance symmetrically. Kidneys are otherwise normal, without renal calculi, solid lesion, or hydronephrosis. Bladder is  unremarkable. Stomach/Bowel: Tiny hiatal hernia. Similar degree of abnormal thickening of the gastric cardia (series 2, image 20). Appendix not identified. Colonic diverticulosis. No evidence of bowel wall thickening, distention, or inflammatory changes. Vascular/Lymphatic: Aortic atherosclerosis. No enlarged abdominal or pelvic lymph nodes. Reproductive: Status post hysterectomy. No adnexal masses. Other: No free fluid. No abdominopelvic fluid collection. No pneumoperitoneum. No abdominal wall hernia. Musculoskeletal: No acute or significant osseous findings. Degenerative disc disease of L5-S1. Multilevel lumbar facet arthropathy. Review of the MIP images confirms the above findings. IMPRESSION: 1. Examination positive for pulmonary embolism involving segmental and subsegmental branch pulmonary arteries within the right lower lobe. No central pulmonary embolism. No evidence of right heart strain. 2. Increased perinephric stranding anterior to the right kidney, which may be secondary to urinary tract infection. 3. Similar degree of abnormal thickening of the gastric cardia. Underlying mass not excluded. Correlate with endoscopy. 4. Colonic diverticulosis without evidence of acute diverticulitis. 5. Aortic and coronary artery atherosclerosis (ICD10-I70.0). These results will be called to the ordering clinician or representative by the Radiologist Assistant, and communication documented in the PACS or [employer]. Electronically Signed   By: Duanne Guess D.O.   On: 09/07/2023 11:33   CT Angio Chest Pulmonary Embolism (PE) W or WO Contrast Result Date: 09/07/2023 CLINICAL DATA:  Pulmonary embolism (PE) suspected, high prob; Abdominal pain, acute, nonlocalized EXAM: CT ANGIOGRAPHY CHEST CT ABDOMEN AND PELVIS WITH CONTRAST TECHNIQUE: Multidetector CT imaging of the chest was performed using the standard protocol during bolus administration of intravenous contrast. Multiplanar CT image reconstructions and  MIPs were obtained to evaluate the vascular anatomy. Multidetector CT imaging of the abdomen and pelvis was performed using the standard protocol during bolus administration of intravenous contrast. RADIATION DOSE REDUCTION: This exam was performed according to the departmental dose-optimization program which includes automated exposure control, adjustment of the mA and/or kV according to patient size and/or use of iterative reconstruction technique. CONTRAST:  75mL OMNIPAQUE IOHEXOL 350 MG/ML SOLN; OMNIPAQUE IOHEXOL 300 MG/ML SOLN, 30mL OMNIPAQUE IOHEXOL 300 MG/ML SOLN COMPARISON:  09/05/2023 FINDINGS: CTA CHEST FINDINGS Cardiovascular: Satisfactory opacification of the pulmonary arteries. Filling defects within segmental and subsegmental branch pulmonary arteries within the right lower lobe. No central pulmonary embolism. No evidence of right heart strain. Heart size within normal limits. No significant pericardial effusion. Thoracic aorta is nonaneurysmal. Atherosclerotic calcifications of the aorta and coronary arteries. Mediastinum/Nodes: No enlarged mediastinal, hilar, or axillary lymph nodes. Thyroid gland, trachea, and esophagus demonstrate no significant findings. Tiny hiatal hernia. Lungs/Pleura: Increased dependent bibasilar subsegmental atelectasis. No pleural effusion or pneumothorax. Musculoskeletal: No chest wall abnormality. No acute or significant osseous findings. Degenerative spurring within the midthoracic spine. Review of the MIP images confirms the above findings. CT ABDOMEN and PELVIS FINDINGS Hepatobiliary: No focal liver abnormality is seen. No gallstones, gallbladder wall thickening, or biliary dilatation. Pancreas: Unremarkable. No pancreatic ductal dilatation or surrounding inflammatory changes. Spleen: Normal in size without focal abnormality. Adrenals/Urinary Tract: Adrenal glands are unremarkable. Perinephric stranding anterior to the right kidney, increased from prior. Kidneys  enhance symmetrically. Kidneys are otherwise normal, without renal calculi, solid lesion, or hydronephrosis. Bladder is unremarkable. Stomach/Bowel: Tiny hiatal hernia. Similar degree of abnormal thickening of the gastric cardia (series 2, image 20). Appendix not identified. Colonic diverticulosis. No evidence of bowel wall thickening, distention, or inflammatory changes. Vascular/Lymphatic: Aortic atherosclerosis. No enlarged abdominal or pelvic  lymph nodes. Reproductive: Status post hysterectomy. No adnexal masses. Other: No free fluid. No abdominopelvic fluid collection. No pneumoperitoneum. No abdominal wall hernia. Musculoskeletal: No acute or significant osseous findings. Degenerative disc disease of L5-S1. Multilevel lumbar facet arthropathy. Review of the MIP images confirms the above findings. IMPRESSION: 1. Examination positive for pulmonary embolism involving segmental and subsegmental branch pulmonary arteries within the right lower lobe. No central pulmonary embolism. No evidence of right heart strain. 2. Increased perinephric stranding anterior to the right kidney, which may be secondary to urinary tract infection. 3. Similar degree of abnormal thickening of the gastric cardia. Underlying mass not excluded. Correlate with endoscopy. 4. Colonic diverticulosis without evidence of acute diverticulitis. 5. Aortic and coronary artery atherosclerosis (ICD10-I70.0). These results will be called to the ordering clinician or representative by the Radiologist Assistant, and communication documented in the PACS or [employer]. Electronically Signed   By: Duanne Guess D.O.   On: 09/07/2023 11:33   US Venous Img Lower Bilateral (DVT) Result Date: 09/06/2023 CLINICAL DATA:  BILATERAL lower extremity edema 9965.  Pain. EXAM: BILATERAL LOWER EXTREMITY VENOUS DOPPLER ULTRASOUND TECHNIQUE: Gray-scale sonography with graded compression, as well as color Doppler and duplex ultrasound were performed to  evaluate the lower extremity deep venous systems from the level of the common femoral vein and including the common femoral, femoral, profunda femoral, popliteal and calf veins including the posterior tibial, peroneal and gastrocnemius veins when visible. The superficial great saphenous vein was also interrogated. Spectral Doppler was utilized to evaluate flow at rest and with distal augmentation maneuvers in the common femoral, femoral and popliteal veins. COMPARISON:  CT CAP, 09/05/2023. FINDINGS: RIGHT LOWER EXTREMITY VENOUS Normal compressibility of the RIGHT common femoral vein. Incomplete evaluation at the superficial femoral, and popliteal veins, as well as the calf veins secondary to patient discomfort with compression. Limitations: Patient could not tolerate compressions LEFT LOWER EXTREMITY VENOUS Normal compressibility of the LEFT common femoral, superficial femoral, and popliteal veins, as well as the visualized calf veins. Visualized portions of profunda femoral vein and great saphenous vein unremarkable. No filling defects to suggest DVT on grayscale or color Doppler imaging. Doppler waveforms show normal direction of venous flow, normal respiratory plasticity and response to augmentation. OTHER No evidence of superficial thrombophlebitis or abnormal fluid collection. Limitations: none IMPRESSION: Suboptimal evaluation, within these constraints; 1. No evidence of femoropopliteal DVT or superficial thrombophlebitis within the LEFT lower extremity. 2. Incomplete assessment of the RIGHT lower extremity secondary to patient discomfort. Roanna Banning, MD [facility] Electronically Signed   By: Roanna Banning M.D.   On: 09/06/2023 09:57   CT CHEST ABDOMEN PELVIS WO CONTRAST Result Date: 09/05/2023 CLINICAL DATA:  Sepsis, dementia EXAM: CT CHEST, ABDOMEN AND PELVIS WITHOUT CONTRAST TECHNIQUE: Multidetector CT imaging of the chest, abdomen and pelvis  was performed following the standard protocol without IV contrast. RADIATION DOSE REDUCTION: This exam was performed according to the departmental dose-optimization program which includes automated exposure control, adjustment of the mA and/or kV according to patient size and/or use of iterative reconstruction technique. COMPARISON:  None Available. FINDINGS: CT CHEST FINDINGS Cardiovascular: The heart is normal in size. No pericardial effusion. No evidence of thoracic aortic aneurysm. Atherosclerotic calcifications of the aortic arch. Severe three-coronary atherosclerosis. Mediastinum/Nodes: No suspicious mediastinal lymphadenopathy. Subcentimeter calcified left thyroid nodules, benign. No follow-up is recommended. Lungs/Pleura: Mild subpleural ground-glass opacity with central low-density at the lateral right lung base (series 4/image 108). This appearance is nonspecific but at  least raises the possibility of an early pulmonary infarct. Mild bibasilar atelectasis/scarring. No suspicious pulmonary nodules. No pleural effusion or pneumothorax. Musculoskeletal: Degenerative changes of the mid thoracic spine. CT ABDOMEN PELVIS FINDINGS Hepatobiliary: Unenhanced liver is unremarkable. Gallbladder is unremarkable. No intrahepatic or extrahepatic duct dilatation. Pancreas: Within normal limits. Spleen: Within normal limits but Adrenals/Urinary Tract: Adrenal glands are within normal limits. Left kidney is within normal limits. Right renal cysts measuring up to 2.0 cm (series 3/image 69), benign (Bosniak I). No follow-up is recommended. No renal, ureteral, or bladder calculi.  No hydronephrosis. Bladder is within normal limits. Stomach/Bowel: Stomach is notable for a tiny hiatal hernia. Soft tissue prominence at the gastric cardia (series 3/image 51), poorly evaluated on unenhanced CT, but at least raising the possibility of a mass. No evidence of bowel obstruction. Appendix is not discretely visualized. Extensive sigmoid  diverticulosis, without evidence of diverticulitis. Vascular/Lymphatic: No evidence of abdominal aortic aneurysm. Atherosclerotic calcifications of the abdominal aorta and branch vessels. No suspicious abdominopelvic lymphadenopathy. Reproductive: Status post hysterectomy. No adnexal masses. Other: No abdominopelvic ascites. Musculoskeletal: Mild degenerative changes at L5-S1. IMPRESSION: Focal ground-glass opacity at the right lung base, nonspecific. However, early pulmonary infarct could have this appearance. Consider PE protocol CT for further evaluation, as clinically warranted. Soft tissue prominence at the gastric cardia, poorly evaluated on unenhanced CT, but at least raising the possibility of a mass. Consider CT abdomen with contrast for further evaluation. Extensive sigmoid diverticulosis, without evidence of diverticulitis. Aortic Atherosclerosis (ICD10-I70.0). Electronically Signed   By: Charline Bills M.D.   On: 09/05/2023 20:56   CT Head Wo Contrast Result Date: 09/05/2023 CLINICAL DATA:  Altered mental status. EXAM: CT HEAD WITHOUT CONTRAST TECHNIQUE: Contiguous axial images were obtained from the base of the skull through the vertex without intravenous contrast. RADIATION DOSE REDUCTION: This exam was performed according to the departmental dose-optimization program which includes automated exposure control, adjustment of the mA and/or kV according to patient size and/or use of iterative reconstruction technique. COMPARISON:  None Available. FINDINGS: Brain: Mild age-related atrophy and chronic microvascular ischemic changes. There is no acute intracranial hemorrhage. No mass effect or midline shift. No extra-axial fluid collection. Vascular: No hyperdense vessel or unexpected calcification. Skull: Normal. Negative for fracture or focal lesion. Sinuses/Orbits: No acute finding. Other: None IMPRESSION: 1. No acute intracranial pathology. 2. Mild age-related atrophy and chronic microvascular  ischemic changes. Electronically Signed   By: Elgie Collard M.D.   On: 09/05/2023 20:45   DG Chest Port 1 View Result Date: 09/05/2023 CLINICAL DATA:  Questionable sepsis - evaluate for abnormality EXAM: PORTABLE CHEST 1 VIEW COMPARISON:  None Available. FINDINGS: Normal heart size. Normal mediastinal contour. No pneumothorax. No pleural effusion. Lungs appear clear, with no acute consolidative airspace disease and no pulmonary edema. IMPRESSION: No active disease. Electronically Signed   By: Delbert Phenix M.D.   On: 09/05/2023 17:44    Microbiology: Results for orders placed or performed during the hospital encounter of 09/05/23  Resp panel by RT-PCR (RSV, Flu A&B, Covid) Anterior Nasal Swab     Status: None   Collection Time: 09/05/23  4:01 PM   Specimen: Anterior Nasal Swab  Result Value Ref Range Status   SARS Coronavirus 2 by RT PCR NEGATIVE NEGATIVE Final    Comment: (NOTE) SARS-CoV-2 target nucleic acids are NOT DETECTED.  The SARS-CoV-2 RNA is generally detectable in upper respiratory specimens during the acute phase of infection. The lowest concentration of SARS-CoV-2 viral copies this assay can detect  is 138 copies/mL. A negative result does not preclude SARS-Cov-2 infection and should not be used as the sole basis for treatment or other patient management decisions. A negative result may occur with  improper specimen collection/handling, submission of specimen other than nasopharyngeal swab, presence of viral mutation(s) within the areas targeted by this assay, and inadequate number of viral copies(<138 copies/mL). A negative result must be combined with clinical observations, patient history, and epidemiological information. The expected result is Negative.  Fact Sheet for Patients:  BloggerCourse.com  Fact Sheet for Healthcare Providers:  SeriousBroker.it  This test is no t yet approved or cleared by the [place]  FDA and  has been authorized for detection and/or diagnosis of SARS-CoV-2 by FDA under an Emergency Use Authorization (EUA). This EUA will remain  in effect (meaning this test can be used) for the duration of the COVID-19 declaration under Section 564(b)(1) of the Act, 21 U.S.C.section 360bbb-3(b)(1), unless the authorization is terminated  or revoked sooner.       Influenza A by PCR NEGATIVE NEGATIVE Final   Influenza B by PCR NEGATIVE NEGATIVE Final    Comment: (NOTE) The Xpert Xpress SARS-CoV-2/FLU/RSV plus assay is intended as an aid in the diagnosis of influenza from Nasopharyngeal swab specimens and should not be used as a sole basis for treatment. Nasal washings and aspirates are unacceptable for Xpert Xpress SARS-CoV-2/FLU/RSV testing.  Fact Sheet for Patients: BloggerCourse.com  Fact Sheet for Healthcare Providers: SeriousBroker.it  This test is not yet approved or cleared by the [place] FDA and has been authorized for detection and/or diagnosis of SARS-CoV-2 by FDA under an Emergency Use Authorization (EUA). This EUA will remain in effect (meaning this test can be used) for the duration of the COVID-19 declaration under Section 564(b)(1) of the Act, 21 U.S.C. section 360bbb-3(b)(1), unless the authorization is terminated or revoked.     Resp Syncytial Virus by PCR NEGATIVE NEGATIVE Final    Comment: (NOTE) Fact Sheet for Patients: BloggerCourse.com  Fact Sheet for Healthcare Providers: SeriousBroker.it  This test is not yet approved or cleared by the [place] FDA and has been authorized for detection and/or diagnosis of SARS-CoV-2 by FDA under an Emergency Use Authorization (EUA). This EUA will remain in effect (meaning this test can be used) for the duration of the COVID-19 declaration under Section 564(b)(1) of the Act, 21 U.S.C. section  360bbb-3(b)(1), unless the authorization is terminated or revoked.  Performed at [facility], [address], [place], [place] [address]   Blood Culture (routine x 2)     Status: None   Collection Time: 09/05/23  4:16 PM   Specimen: BLOOD  Result Value Ref Range Status   Specimen Description BLOOD RIGHT ANTECUBITAL  Final   Special Requests   Final    BOTTLES DRAWN AEROBIC AND ANAEROBIC Blood Culture adequate volume   Culture   Final    NO GROWTH 5 DAYS Performed at [facility], [address], [place], [place] [address]    Report Status 09/10/2023 FINAL  Final  Blood Culture (routine x 2)     Status: None   Collection Time: 09/05/23  5:50 PM   Specimen: Left Antecubital; Blood  Result Value Ref Range Status   Specimen Description LEFT ANTECUBITAL  Final   Special Requests   Final    BOTTLES DRAWN AEROBIC ONLY Blood Culture results may not be optimal due to an inadequate volume of blood received in culture bottles   Culture   Final  NO GROWTH 5 DAYS Performed at [facility], [address], [place], [place] [address]    Report Status 09/10/2023 FINAL  Final  MRSA Next Gen by PCR, Nasal     Status: None   Collection Time: 09/06/23  8:10 AM   Specimen: Nasal Mucosa; Nasal Swab  Result Value Ref Range Status   MRSA by PCR Next Gen NOT DETECTED NOT DETECTED Final    Comment: (NOTE) The GeneXpert MRSA Assay (FDA approved for NASAL specimens only), is one component of a comprehensive MRSA colonization surveillance program. It is not intended to diagnose MRSA infection nor to guide or monitor treatment for MRSA infections. Test performance is not FDA approved in patients less than 86 years old. Performed at [facility], [address], [place], [place] [address]   Urine Culture     Status: Abnormal   Collection Time: 09/06/23 11:12 AM   Specimen: Urine, Clean Catch  Result Value Ref Range Status   Specimen Description   Final    URINE, CLEAN CATCH Performed at [facility], [address], [place], [place] [address]    Special Requests   Final    NONE Reflexed from [id number] Performed at [facility], [address], [place], [place] [address]    Culture (A)  Final    <10,000 COLONIES/mL INSIGNIFICANT GROWTH Performed at [facility], [address], [place], [place] [address]    Report Status 09/08/2023 FINAL  Final    Labs: CBC: Recent Labs  Lab 09/05/23 1758 09/06/23 0545 09/07/23 0408 09/08/23 0442 09/09/23 0442  WBC 13.2* 11.3* 14.2* 11.9* 9.3  NEUTROABS 9.1*  --   --   --   --   HGB 14.1 13.9 12.6 11.2* 10.9*  HCT 48.2* 46.8* 42.3 37.3 35.8*  MCV 83.4 81.3 80.7 80.0 80.8  PLT 419* 416* 396 361 355   Basic Metabolic Panel: Recent Labs  Lab 09/05/23 1616 09/06/23 0545 09/07/23 0408 09/08/23 0442 09/09/23 0442 09/10/23 0445  NA 146* 145 142 141  --  138  K 4.5 4.1 3.6 3.1* 3.7 3.3*  CL 101 107 107 105  --  105  CO2 20* 23 23 23   --  21*  GLUCOSE 145* 106* 124* 105*  --  96  BUN 48* 33* 18 15  --  13  CREATININE 1.36* 0.90 0.85 0.88  --  0.77  CALCIUM 10.7* 9.5 8.9 8.8*  --  8.8*  MG 2.2 1.7  --   --   --  1.6*  PHOS  --  2.3*  --   --   --   --    Liver Function Tests: Recent Labs  Lab 09/05/23 1616 09/06/23 0545 09/07/23 0408 09/08/23 0442  AST 31 29 26 24   ALT 24 20 17 16   ALKPHOS 64 53 49 44  BILITOT 1.2* 0.9 0.8 0.7  PROT 9.3* 7.7 6.9 6.8  ALBUMIN 4.1 3.4* 2.9* 2.6*   CBG: Recent Labs  Lab 09/08/23 2110 09/09/23 0749 09/09/23 1133 09/09/23 1707 09/10/23 0736  GLUCAP 99 91 102* 88 93    Discharge time spent: greater than 30 minutes.  Signed: Catarina Hartshorn, MD [facility] 09/10/2023

## 2023-09-10 NOTE — TOC Transition Note (Signed)
Transition of Care [facility]) - Discharge Note   Patient Details  Name: Michelle Mcclain MRN: [id number] Date of Birth: [birth year]  [facility] of [facility]) CM/SW Contact:  Villa Herb, LCSWA Phone Number: 09/10/2023, 11:42 AM   Clinical Narrative:    CSW noted that pts insurance Berkley Harvey has been approved for SNF at [facility] CSW spoke to [place] with SNF who confirms they can accept pt today. CSW provided RN with room and report numbers. CSW sent D/C clinicals to facility via HUB. CSW updated pts daughter on plan for D/C. Med necessity to be printed to floor. EMS called for transport. TOC signing off.   Final next level of care: Skilled Nursing Facility Barriers to Discharge: Barriers Resolved   Patient Goals and CMS Choice Patient states their goals for this hospitalization and ongoing recovery are:: go to SNF CMS Medicare.gov Compare Post Acute Care list provided to:: Patient Represenative (must comment) Choice offered to / list presented to : Spouse, Adult Children      Discharge Placement                Patient to be transferred to facility by: EMS Name of family member notified: daughter Patient and family notified of of transfer: 09/10/23  Discharge Plan and Services Additional resources added to the After Visit Summary for   In-house Referral: Clinical Social Work                                   Social Drivers of Health (SDOH) Interventions SDOH Screenings   Food Insecurity: No Food Insecurity (09/06/2023)  Housing: Low Risk  (09/06/2023)  Transportation Needs: No Transportation Needs (09/06/2023)  Utilities: Not At Risk (09/06/2023)  Tobacco Use: Unknown (09/05/2023)     Readmission Risk Interventions     No data to display

## 2023-09-10 NOTE — Plan of Care (Signed)

## 2023-09-23 NOTE — Progress Notes (Signed)
 Cardiology Office Note:  .   Date:  10/04/2023  ID:  Michelle Mcclain, DOB 10/16/1940, MRN [id number] PCP: Spencer Ka, MD (Inactive)  [facility] Providers Cardiologist:  None    History of Present Illness: .   Michelle Mcclain is a 82 y.o. female  with history of HTN, HLD, dementia admitted to the hospital 08/2023 with sepsis secondary to UTI.   Hospital course  was complicated by new onset of sinus tachycardia and imaging evidence of acute PE.  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 Cardiology saw and would not recommend LHC due to advanced dementia,  heparin  drip and switch to Eliquis  upon discharge.  Soft blood pressures limit initiation of GDMT.  Will continue metoprolol  succinate 12.5 mg once daily as she tolerated well with no symptoms.  Will need limited echocardiogram in 1 to 2 months in the outpatient setting to assess improvement in LVEF.   Patient comes from [facility] alone in a wheelchair-was dropped off. She can only answer some questions. Knows she's in [place] but can't answer questions on where she lives or family. Denies chest pain, palpitations, edema. Appears comfortable but HR 119/m and BP up.   ROS:    Studies Reviewed: SABRA    EKG Interpretation Date/Time:  Tuesday October 04 2023 12:32:45 EST Ventricular Rate:  119 PR Interval:  138 QRS Duration:  64 QT Interval:  342 QTC Calculation: 481 R Axis:   78  Text Interpretation: Sinus tachycardia ST & Marked T wave abnormality, consider inferior ischemia When compared with ECG of 07-Sep-2023 08:26, Questionable change in QRS duration Minimal criteria for Anterior infarct are no longer Present Criteria for Inferior infarct are no longer Present Reviewed with Dr. Alvan and felt to be evolving ischemia-pt asymptomatic Confirmed by Parthenia Klinefelter [phone]) on 10/04/2023 12:53:28 PM    Prior CV Studies:     Echo 08/2023 IMPRESSIONS     1. Left ventricular ejection fraction, by estimation, is 30 to 35%. The  left ventricle has moderately decreased function. The left ventricle  demonstrates regional wall motion abnormalities suggestive of Takostubo  cardiomyopathy versus LAD infarction.  There is mild left ventricular hypertrophy. Indeterminate diastolic  filling due to E-A fusion.   2. Right ventricular systolic function is hyperdynamic. The right  ventricular size is mildly enlarged.   3. The mitral valve is normal in structure. No evidence of mitral valve  regurgitation. No evidence of mitral stenosis.   4. The aortic valve was not well visualized. Aortic valve regurgitation  is not visualized. No aortic stenosis is present.   5. The inferior vena cava is normal in size but collapsibility could not  be evaluated.   Comparison(s): No prior Echocardiogram.   FINDINGS   Left Ventricle: Left ventricular ejection fraction, by estimation, is 30  to 35%. The left ventricle has moderately decreased function. The left  ventricle demonstrates regional wall motion abnormalities. The left  ventricular internal cavity size was  normal in size. There is mild left ventricular hypertrophy. Indeterminate  diastolic filling due to E-A fusion.     LV Wall Scoring:  The entire anterior wall, mid and distal anterior septum, entire apex, mid  and distal inferior wall, mid anterolateral segment, and mid inferoseptal  segment are akinetic. The basal anteroseptal segment, basal inferolateral  segment, basal anterolateral segment, basal inferior segment, and basal  inferoseptal segment are normal.   Right Ventricle: The right  ventricular size is mildly enlarged. No  increase in right ventricular wall thickness. Right ventricular systolic  function is hyperdynamic.   Left Atrium: Left atrial size was normal in size.   Right Atrium: Right atrial size was normal in size.   Pericardium: There is no  evidence of pericardial effusion.   Mitral Valve: The mitral valve is normal in structure. No evidence of  mitral valve regurgitation. No evidence of mitral valve stenosis.   Tricuspid Valve: The tricuspid valve is grossly normal. Tricuspid valve  regurgitation is trivial. No evidence of tricuspid stenosis.   Aortic Valve: The aortic valve was not well visualized. Aortic valve  regurgitation is not visualized. No aortic stenosis is present.   Pulmonic Valve: The pulmonic valve was not well visualized. Pulmonic valve  regurgitation is not visualized. No evidence of pulmonic stenosis.   Aorta: The aortic root and ascending aorta are structurally normal, with  no evidence of dilitation.   Venous: The inferior vena cava is normal in size but collapsibility could  not be evaluated.   IAS/Shunts: No atrial level shunt detected by color flow Doppler.  Risk Assessment/Calculations:     HYPERTENSION CONTROL Vitals:   10/04/23 1216 10/04/23 1239  BP: (!) 150/94 (!) 140/84    The patient's blood pressure is elevated above target today.  In order to address the patient's elevated BP: A current anti-hypertensive medication was adjusted today.; Blood pressure will be monitored at home to determine if medication changes need to be made.; Follow up with general cardiology has been recommended.; Follow up with primary care provider for management.          Physical Exam:   VS:  BP (!) 140/84 (BP Location: Right Arm, Patient Position: Sitting, Cuff Size: Normal)   Pulse (!) 117   SpO2 98%    Wt Readings from Last 3 Encounters:  09/06/23 167 lb 1.7 oz (75.8 kg)  01/09/15 215 lb (97.5 kg)    GEN: Well nourished, well developed in no acute distress NECK: No JVD; No carotid bruits CARDIAC:  RRR, no murmurs, rubs, gallops RESPIRATORY:  Clear to auscultation without rales, wheezing or rhonchi  ABDOMEN: Soft, non-tender, non-distended EXTREMITIES:  No edema; No deformity   ASSESSMENT AND  PLAN: .     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 Alvan who concurs most likely evolving stress induced ischemia. Patient is asymptomatic. Titrate up toprol  to 50 mg daily, check labs and f/u echo and appt with MD.  Possible gastric mass  Dementia-not oriented to place and time. Patient here alone, dropped off from [facility].  HTN-BP elevated-titrate up toprol           Dispo: f/u in 2 months  Signed, Olivia Pavy, PA-C

## 2023-10-04 ENCOUNTER — Encounter: Payer: Self-pay | Admitting: Physician Assistant

## 2023-10-04 ENCOUNTER — Ambulatory Visit: Payer: Medicare Other | Attending: Physician Assistant | Admitting: Physician Assistant

## 2023-10-04 VITALS — BP 140/84 | HR 117

## 2023-10-04 DIAGNOSIS — I2699 Other pulmonary embolism without acute cor pulmonale: Secondary | ICD-10-CM | POA: Diagnosis not present

## 2023-10-04 DIAGNOSIS — R9431 Abnormal electrocardiogram [ECG] [EKG]: Secondary | ICD-10-CM | POA: Diagnosis not present

## 2023-10-04 DIAGNOSIS — I429 Cardiomyopathy, unspecified: Secondary | ICD-10-CM

## 2023-10-04 DIAGNOSIS — I10 Essential (primary) hypertension: Secondary | ICD-10-CM

## 2023-10-04 DIAGNOSIS — F0390 Unspecified dementia without behavioral disturbance: Secondary | ICD-10-CM

## 2023-10-04 MED ORDER — METOPROLOL SUCCINATE ER 50 MG PO TB24: 50.0000 mg | ORAL_TABLET | Freq: Every day | ORAL | 3 refills | Status: AC

## 2023-10-04 NOTE — Patient Instructions (Signed)
 Medication Instructions:  Your physician has recommended you make the following change in your medication:   Increase Toprol  XL to 50 mg Daily   *If you need a refill on your cardiac medications before your next appointment, please call your pharmacy*   Lab Work: NONE   If you have labs (blood work) drawn today and your tests are completely normal, you will receive your results only by: MyChart Message (if you have MyChart) OR A paper copy in the mail If you have any lab test that is abnormal or we need to change your treatment, we will call you to review the results.   Testing/Procedures: Your physician has requested that you have an echocardiogram. Echocardiography is a painless test that uses sound waves to create images of your heart. It provides your doctor with information about the size and shape of your heart and how well your heart's chambers and valves are working. This procedure takes approximately one hour. There are no restrictions for this procedure. Please do NOT wear cologne, perfume, aftershave, or lotions (deodorant is allowed). Please arrive 15 minutes prior to your appointment time.  Please note: We ask at that you not bring children with you during ultrasound (echo/ vascular) testing. Due to room size and safety concerns, children are not allowed in the ultrasound rooms during exams. Our front office staff cannot provide observation of children in our lobby area while testing is being conducted. An adult accompanying a patient to their appointment will only be allowed in the ultrasound room at the discretion of the ultrasound technician under special circumstances. We apologize for any inconvenience.    Follow-Up: At [facility], you and your health needs are our priority.  As part of our continuing mission to provide you with exceptional heart care, we have created designated Provider Care Teams.  These Care Teams include your primary Cardiologist  (physician) and Advanced Practice Providers (APPs -  Physician Assistants and Nurse Practitioners) who all work together to provide you with the care you need, when you need it.  We recommend signing up for the patient portal called MyChart.  Sign up information is provided on this After Visit Summary.  MyChart is used to connect with patients for Virtual Visits (Telemedicine).  Patients are able to view lab/test results, encounter notes, upcoming appointments, etc.  Non-urgent messages can be sent to your provider as well.   To learn more about what you can do with MyChart, go to [email or web address].    Your next appointment:   2 month(s)  Provider:   Vishnu Mallipeddi, MD    Other Instructions Thank you for choosing  [facility]!

## 2023-10-29 DEATH — deceased

## 2023-12-06 ENCOUNTER — Ambulatory Visit (HOSPITAL_COMMUNITY): Admission: RE | Admit: 2023-12-06 | Payer: Self-pay | Source: Ambulatory Visit

## 2023-12-27 ENCOUNTER — Encounter: Payer: Self-pay | Admitting: Internal Medicine

## 2023-12-27 ENCOUNTER — Ambulatory Visit: Payer: Medicare Other | Attending: Internal Medicine | Admitting: Internal Medicine

## 2023-12-27 NOTE — Progress Notes (Signed)
 Erroneous encounter - please disregard.
